# Patient Record
Sex: Female | Born: 1955 | Race: Black or African American | Hispanic: No | Smoking: Never smoker
Health system: Southern US, Community
[De-identification: ages and names within clinical notes are randomized; demographics above are authoritative.]

## PROBLEM LIST (undated history)

## (undated) DIAGNOSIS — J45909 Unspecified asthma, uncomplicated: Secondary | ICD-10-CM

## (undated) DIAGNOSIS — B019 Varicella without complication: Secondary | ICD-10-CM

## (undated) DIAGNOSIS — E669 Obesity, unspecified: Secondary | ICD-10-CM

## (undated) DIAGNOSIS — T7840XA Allergy, unspecified, initial encounter: Secondary | ICD-10-CM

## (undated) DIAGNOSIS — Z0000 Encounter for general adult medical examination without abnormal findings: Secondary | ICD-10-CM

## (undated) DIAGNOSIS — D573 Sickle-cell trait: Secondary | ICD-10-CM

## (undated) DIAGNOSIS — G4700 Insomnia, unspecified: Secondary | ICD-10-CM

## (undated) DIAGNOSIS — Z86010 Personal history of colonic polyps: Secondary | ICD-10-CM

## (undated) DIAGNOSIS — B059 Measles without complication: Secondary | ICD-10-CM

## (undated) DIAGNOSIS — I10 Essential (primary) hypertension: Secondary | ICD-10-CM

## (undated) DIAGNOSIS — E785 Hyperlipidemia, unspecified: Secondary | ICD-10-CM

## (undated) DIAGNOSIS — IMO0001 Reserved for inherently not codable concepts without codable children: Secondary | ICD-10-CM

## (undated) HISTORY — DX: Allergy, unspecified, initial encounter: T78.40XA

## (undated) HISTORY — DX: Measles without complication: B05.9

## (undated) HISTORY — DX: Encounter for general adult medical examination without abnormal findings: Z00.00

## (undated) HISTORY — DX: Obesity, unspecified: E66.9

## (undated) HISTORY — DX: Hyperlipidemia, unspecified: E78.5

## (undated) HISTORY — DX: Unspecified asthma, uncomplicated: J45.909

## (undated) HISTORY — DX: Personal history of colonic polyps: Z86.010

## (undated) HISTORY — DX: Reserved for inherently not codable concepts without codable children: IMO0001

## (undated) HISTORY — DX: Varicella without complication: B01.9

## (undated) HISTORY — DX: Insomnia, unspecified: G47.00

## (undated) HISTORY — DX: Essential (primary) hypertension: I10

## (undated) HISTORY — DX: Sickle-cell trait: D57.3

---

## 1985-01-08 HISTORY — PX: OTHER SURGICAL HISTORY: SHX169

## 1991-01-09 HISTORY — PX: KNEE SURGERY: SHX244

## 2009-01-08 HISTORY — PX: ABDOMINAL HYSTERECTOMY: SHX81

## 2009-10-31 ENCOUNTER — Ambulatory Visit (HOSPITAL_BASED_OUTPATIENT_CLINIC_OR_DEPARTMENT_OTHER): Admission: RE | Admit: 2009-10-31 | Discharge: 2009-10-31 | Payer: Self-pay | Admitting: Family Medicine

## 2009-10-31 ENCOUNTER — Ambulatory Visit: Payer: Self-pay | Admitting: Diagnostic Radiology

## 2010-01-08 LAB — HM COLONOSCOPY

## 2010-09-27 ENCOUNTER — Other Ambulatory Visit (HOSPITAL_BASED_OUTPATIENT_CLINIC_OR_DEPARTMENT_OTHER): Payer: Self-pay | Admitting: Nurse Practitioner

## 2010-09-27 DIAGNOSIS — Z139 Encounter for screening, unspecified: Secondary | ICD-10-CM

## 2010-11-03 ENCOUNTER — Ambulatory Visit (HOSPITAL_BASED_OUTPATIENT_CLINIC_OR_DEPARTMENT_OTHER)
Admission: RE | Admit: 2010-11-03 | Discharge: 2010-11-03 | Disposition: A | Discharge status: Home / Self Care | Payer: Federal, State, Local not specified - PPO | Source: Ambulatory Visit | Attending: Nurse Practitioner | Admitting: Nurse Practitioner

## 2010-11-03 DIAGNOSIS — Z139 Encounter for screening, unspecified: Secondary | ICD-10-CM

## 2010-11-03 DIAGNOSIS — Z1231 Encounter for screening mammogram for malignant neoplasm of breast: Secondary | ICD-10-CM

## 2011-10-09 ENCOUNTER — Other Ambulatory Visit (HOSPITAL_BASED_OUTPATIENT_CLINIC_OR_DEPARTMENT_OTHER): Payer: Self-pay | Admitting: Nurse Practitioner

## 2011-10-09 DIAGNOSIS — Z1231 Encounter for screening mammogram for malignant neoplasm of breast: Secondary | ICD-10-CM

## 2011-11-08 ENCOUNTER — Ambulatory Visit (HOSPITAL_BASED_OUTPATIENT_CLINIC_OR_DEPARTMENT_OTHER)
Admission: RE | Admit: 2011-11-08 | Discharge: 2011-11-08 | Disposition: A | Discharge status: Home / Self Care | Payer: Federal, State, Local not specified - PPO | Source: Ambulatory Visit | Attending: Nurse Practitioner | Admitting: Nurse Practitioner

## 2011-11-08 DIAGNOSIS — Z1231 Encounter for screening mammogram for malignant neoplasm of breast: Secondary | ICD-10-CM

## 2011-11-08 IMAGING — MG MM DIGITAL SCREENING BILAT
3 series · 3 of 3 positions shown · non-contrast
Comparison: Previous exams.

CLINICAL DATA: Screening.

DIGITAL BILATERAL SCREENING MAMMOGRAM WITH CAD

[L CC]
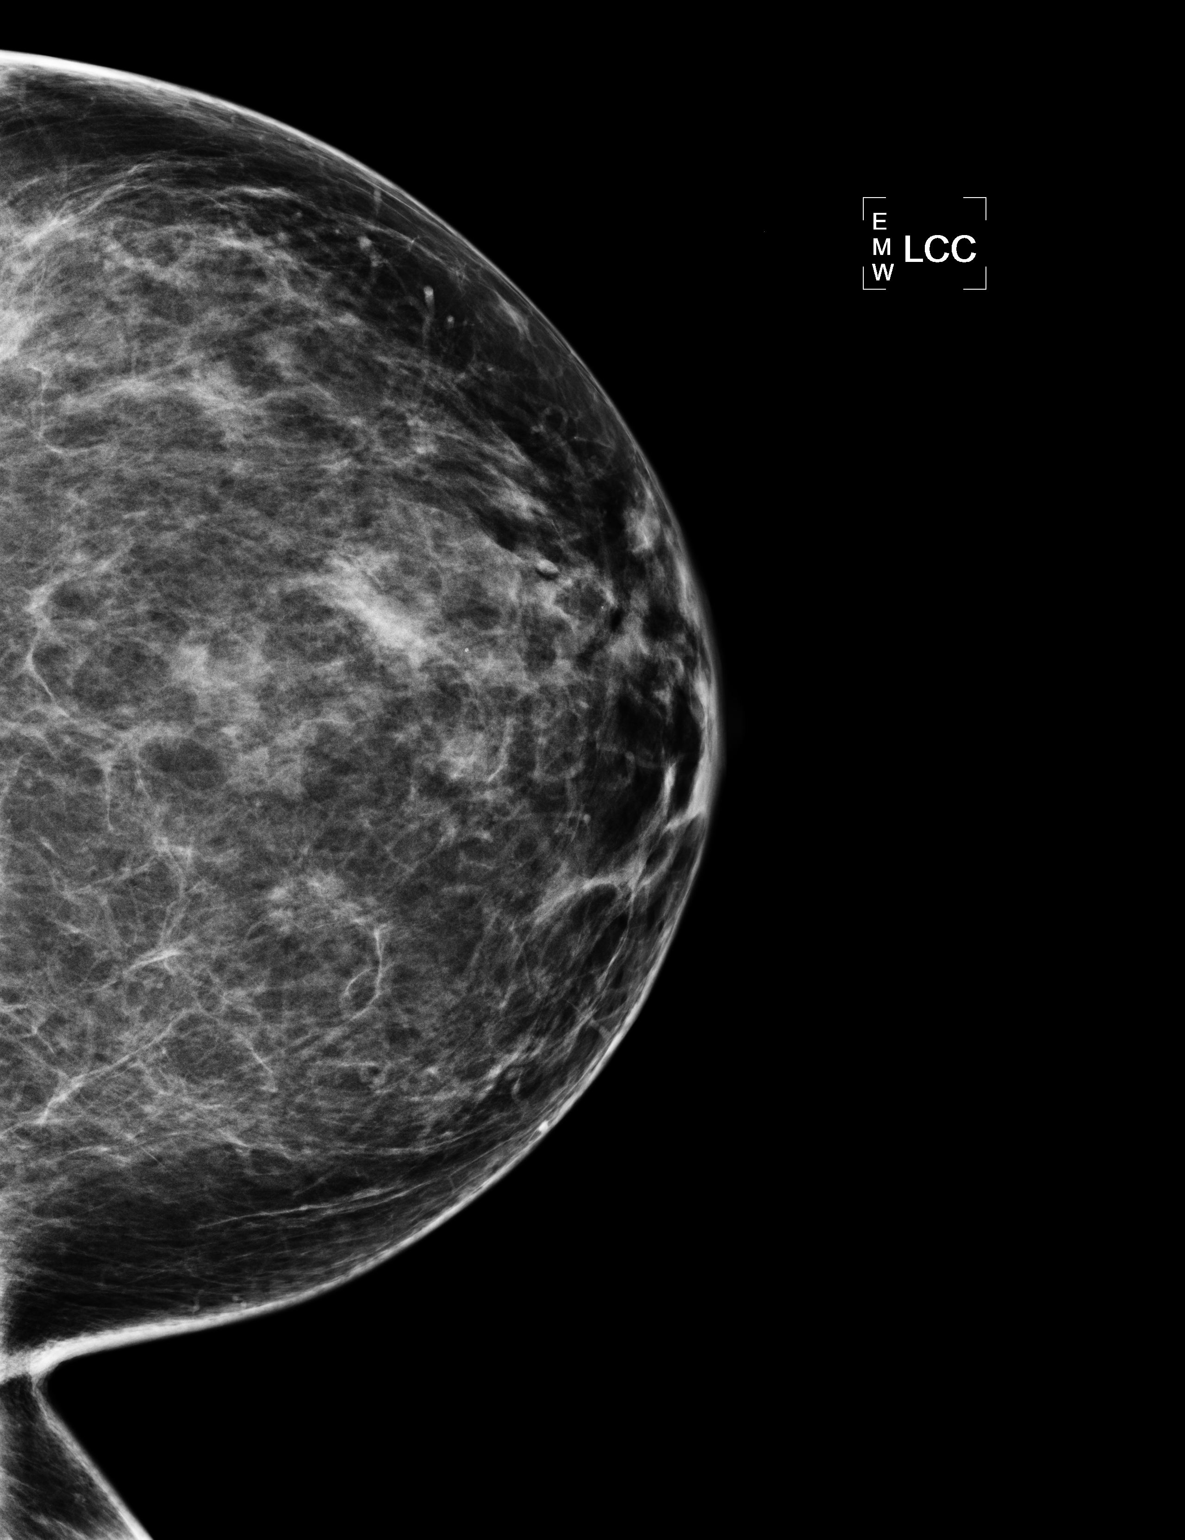

[L MLO]
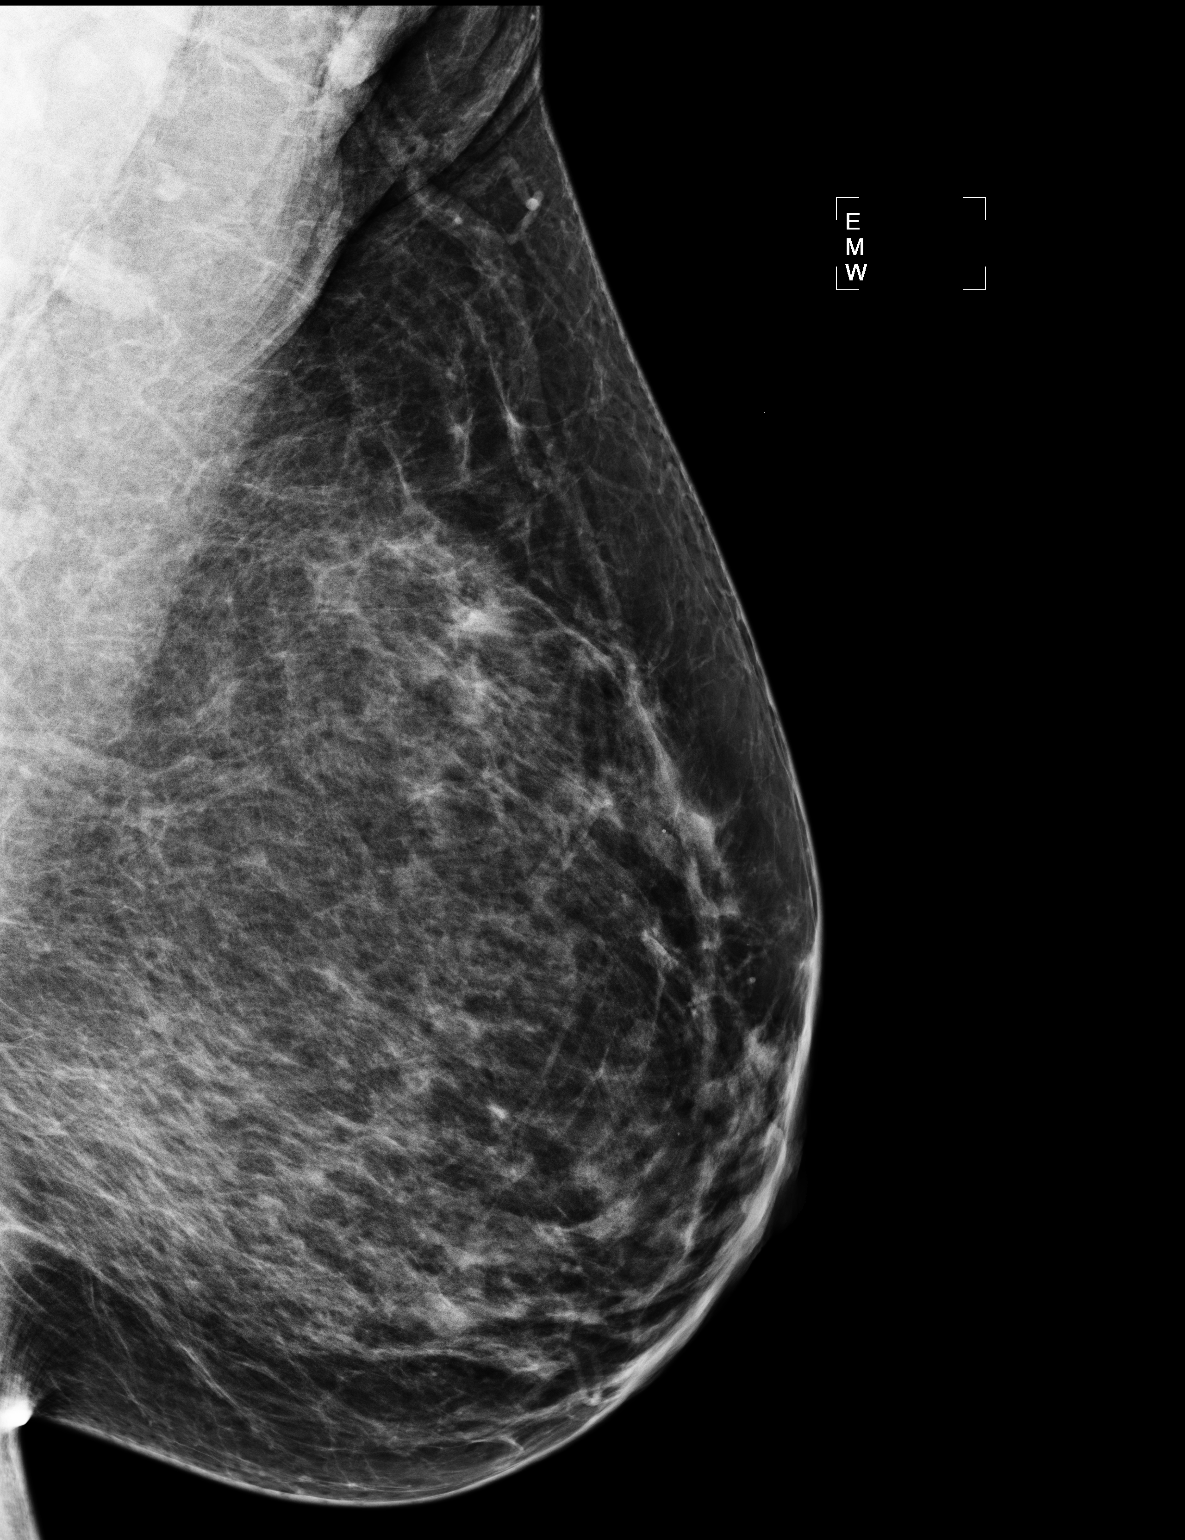

[R MLO]
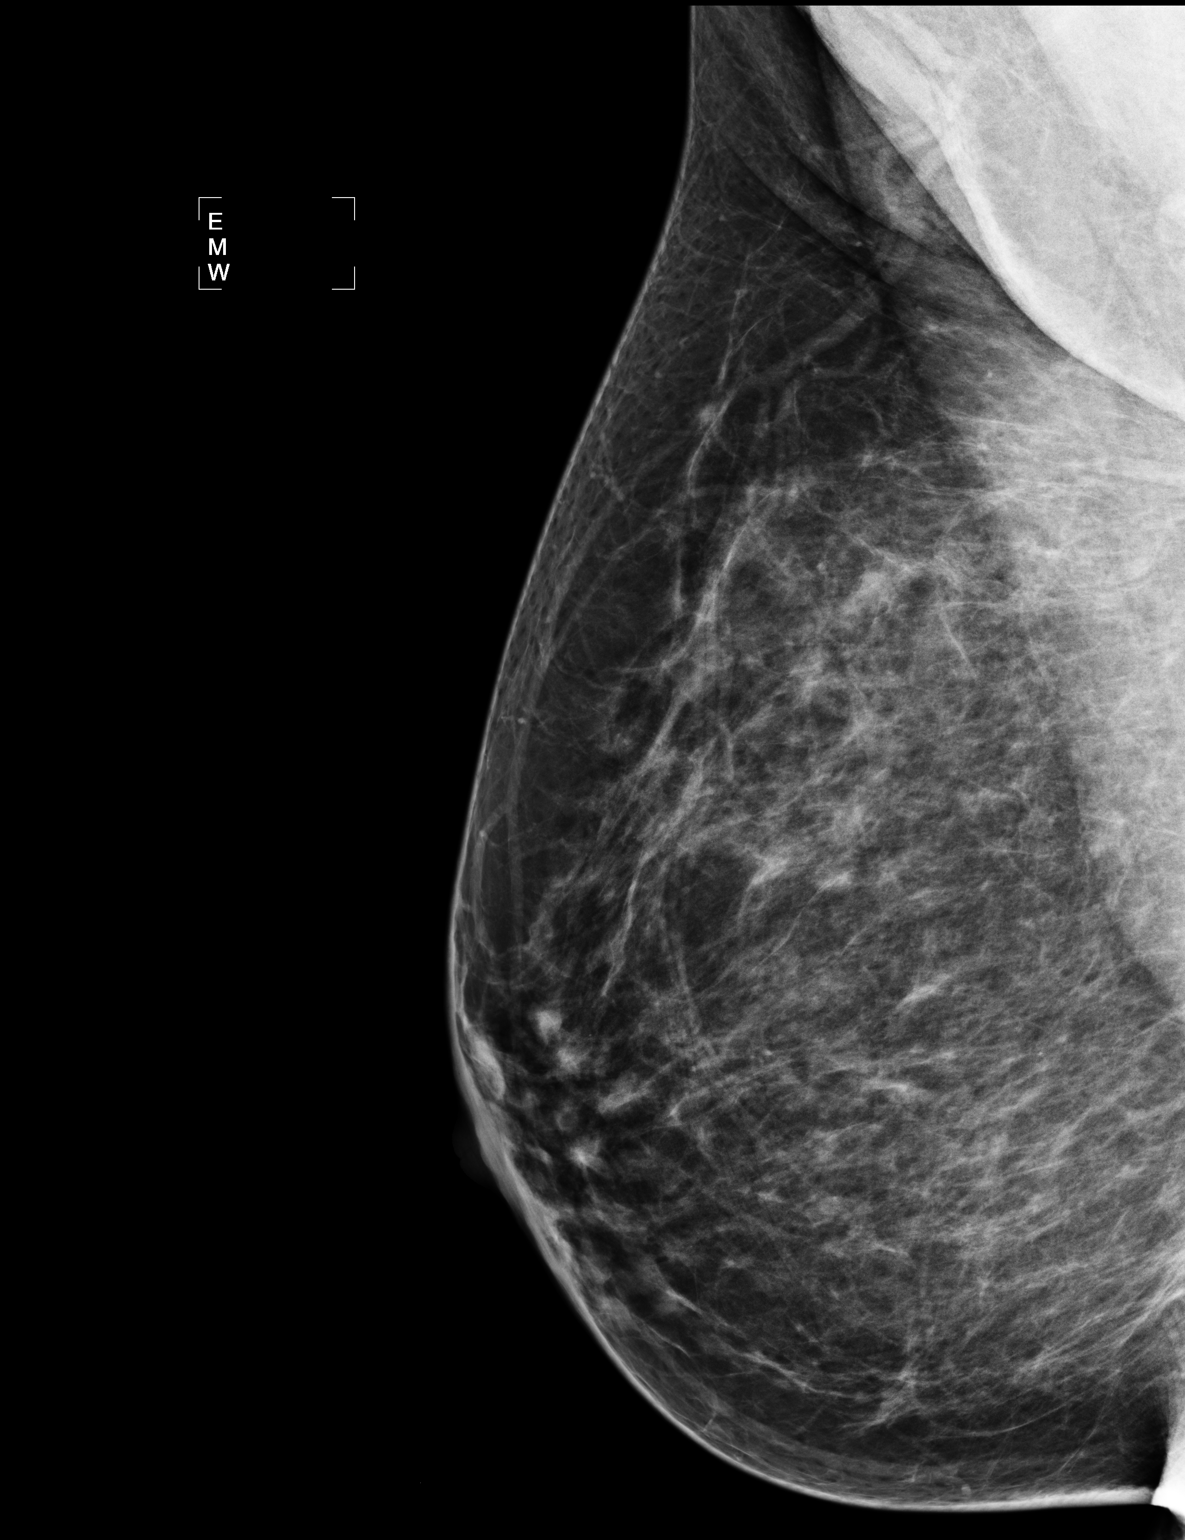

[3 of 3 positions shown; findings below may reference images not displayed]

FINDINGS: There are scattered fibroglandular densities. No
suspicious masses, architectural distortion, or calcifications are
present.

Images were processed with CAD.
IMPRESSION: No mammographic evidence of malignancy.

A result letter of this screening mammogram will be mailed directly
to the patient.

RECOMMENDATION:
Screening mammogram in one year. (Code:[id number])

BI-RADS CATEGORY 1:  Negative.

## 2012-10-06 ENCOUNTER — Other Ambulatory Visit: Payer: Self-pay | Admitting: Obstetrics and Gynecology

## 2012-10-06 ENCOUNTER — Ambulatory Visit (HOSPITAL_BASED_OUTPATIENT_CLINIC_OR_DEPARTMENT_OTHER)
Admission: RE | Admit: 2012-10-06 | Discharge: 2012-10-06 | Disposition: A | Discharge status: Home / Self Care | Payer: Federal, State, Local not specified - PPO | Source: Ambulatory Visit | Attending: Obstetrics and Gynecology | Admitting: Obstetrics and Gynecology

## 2012-10-06 DIAGNOSIS — Z1231 Encounter for screening mammogram for malignant neoplasm of breast: Secondary | ICD-10-CM

## 2012-11-10 ENCOUNTER — Other Ambulatory Visit: Payer: Self-pay | Admitting: Family Medicine

## 2012-11-10 ENCOUNTER — Ambulatory Visit (HOSPITAL_BASED_OUTPATIENT_CLINIC_OR_DEPARTMENT_OTHER)
Admission: RE | Admit: 2012-11-10 | Discharge: 2012-11-10 | Disposition: A | Discharge status: Home / Self Care | Payer: Federal, State, Local not specified - PPO | Source: Ambulatory Visit | Attending: Obstetrics and Gynecology | Admitting: Obstetrics and Gynecology

## 2012-11-10 DIAGNOSIS — Z1231 Encounter for screening mammogram for malignant neoplasm of breast: Secondary | ICD-10-CM

## 2012-11-10 IMAGING — MG MM DIGITAL SCREENING
4 series · 4 of 4 positions shown · non-contrast
Comparison: Previous exam(s).

CLINICAL DATA: Screening.

EXAM:
DIGITAL SCREENING BILATERAL MAMMOGRAM WITH CAD

[R CC]
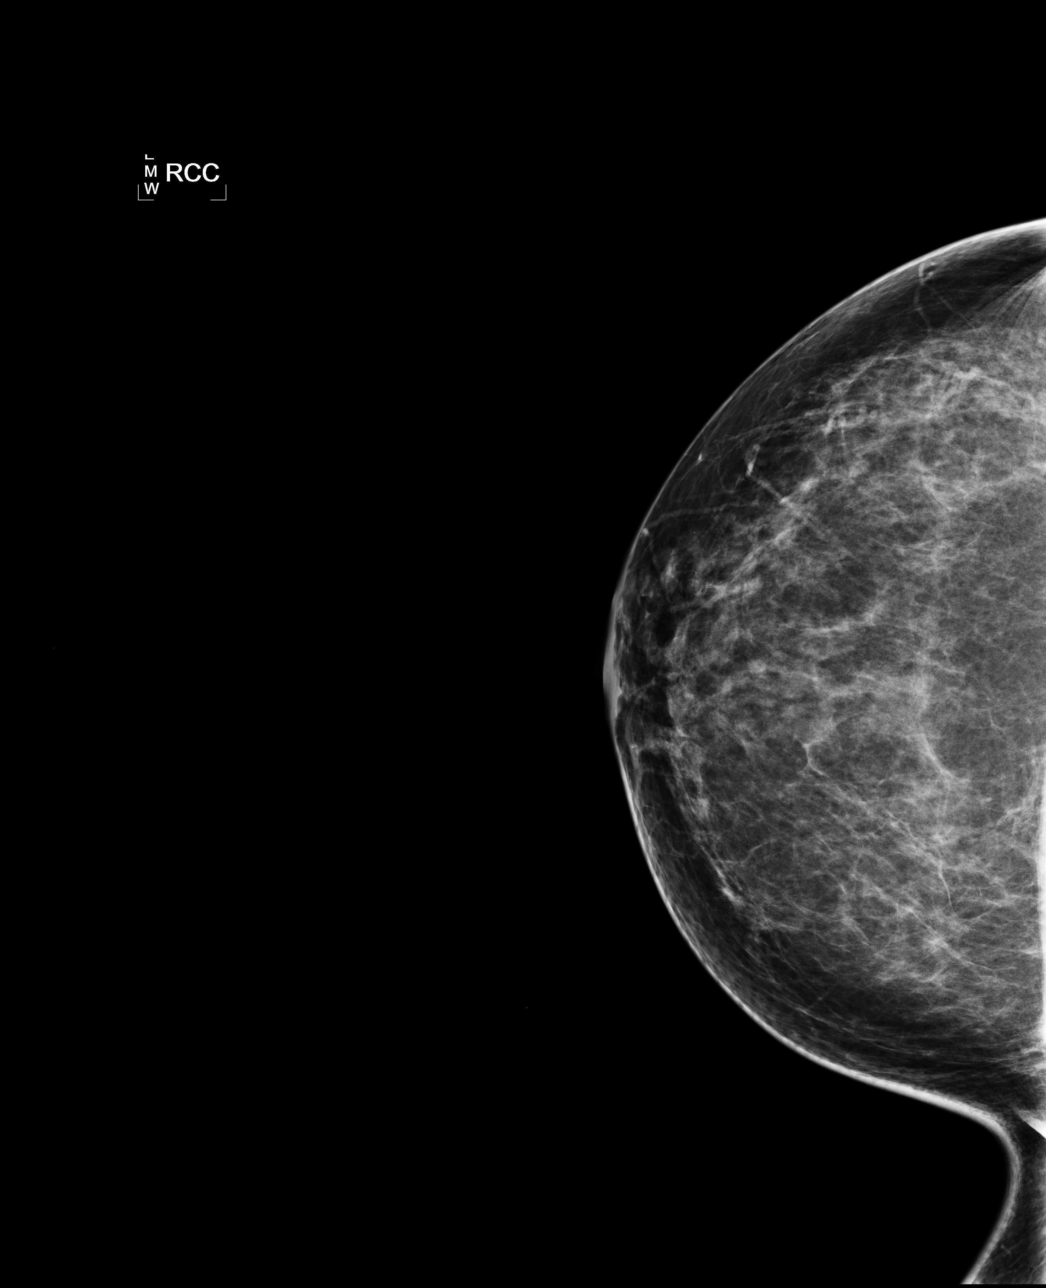

[L CC]
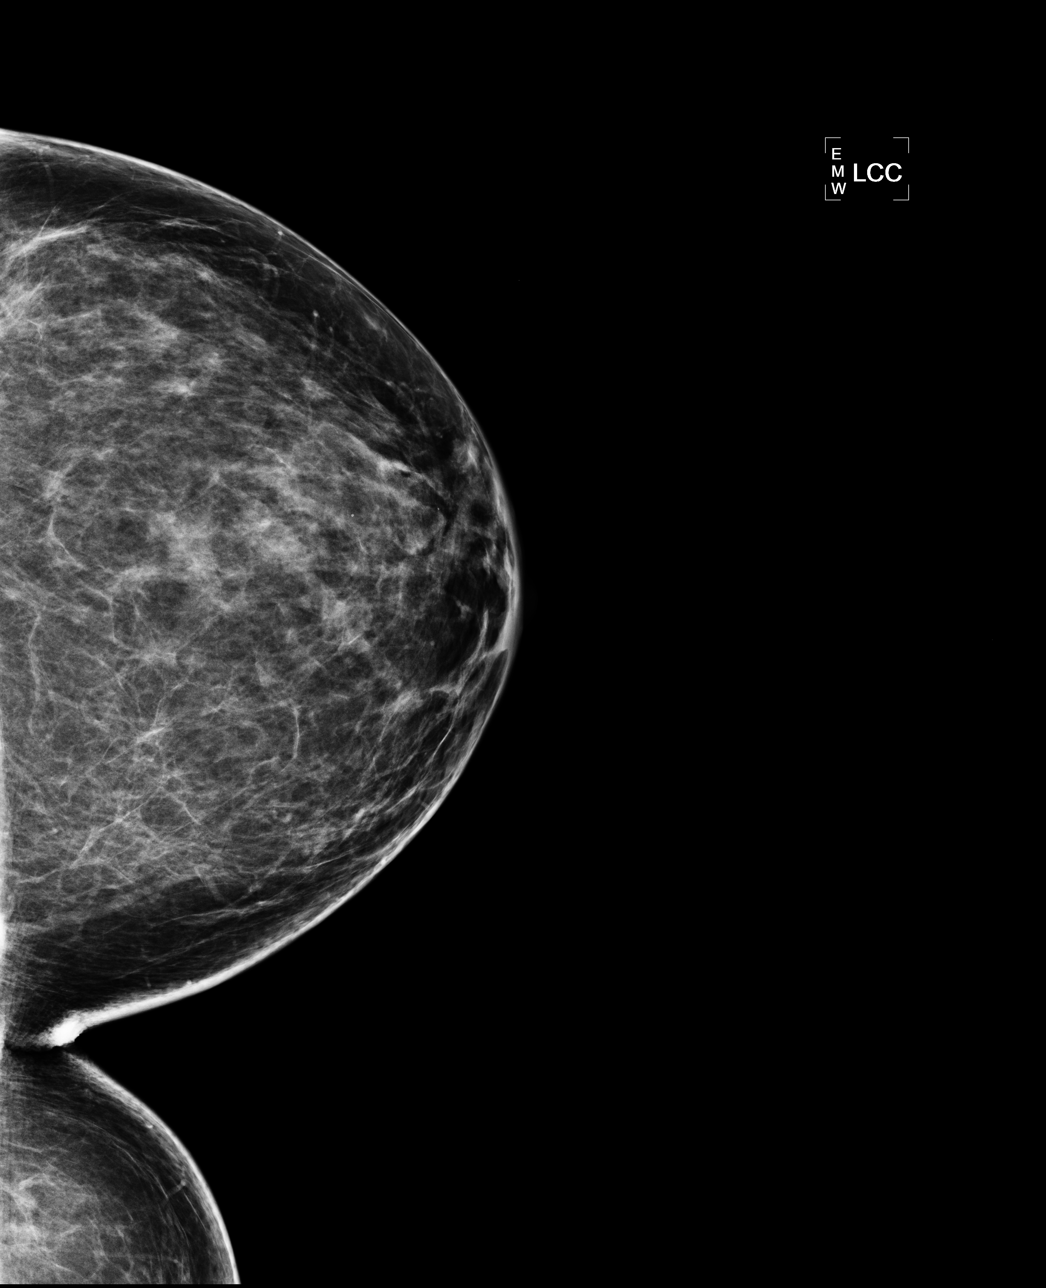

[L MLO]
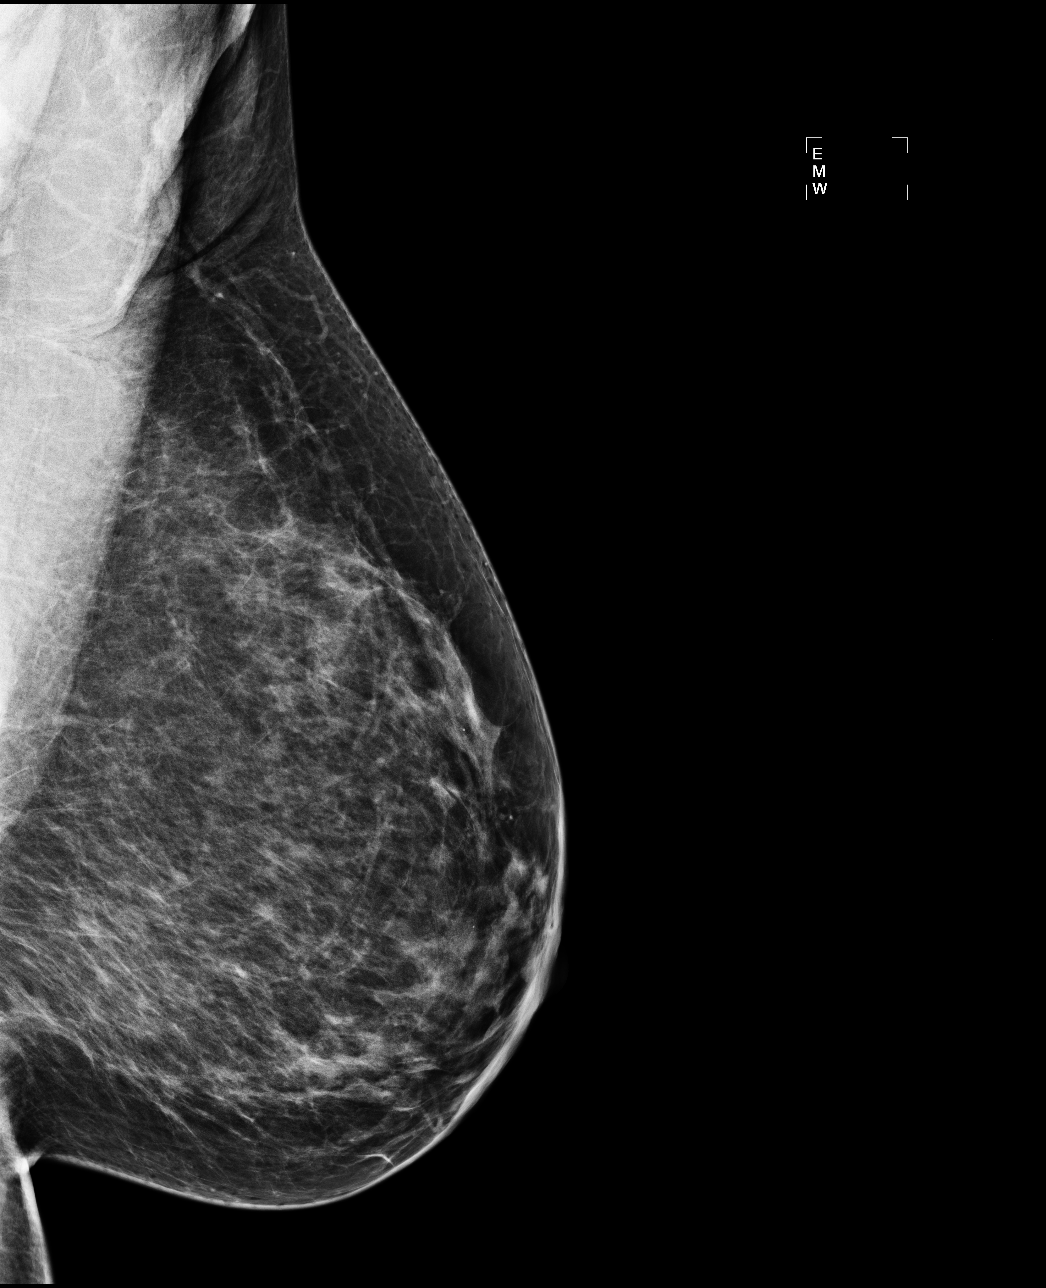

[R MLO]
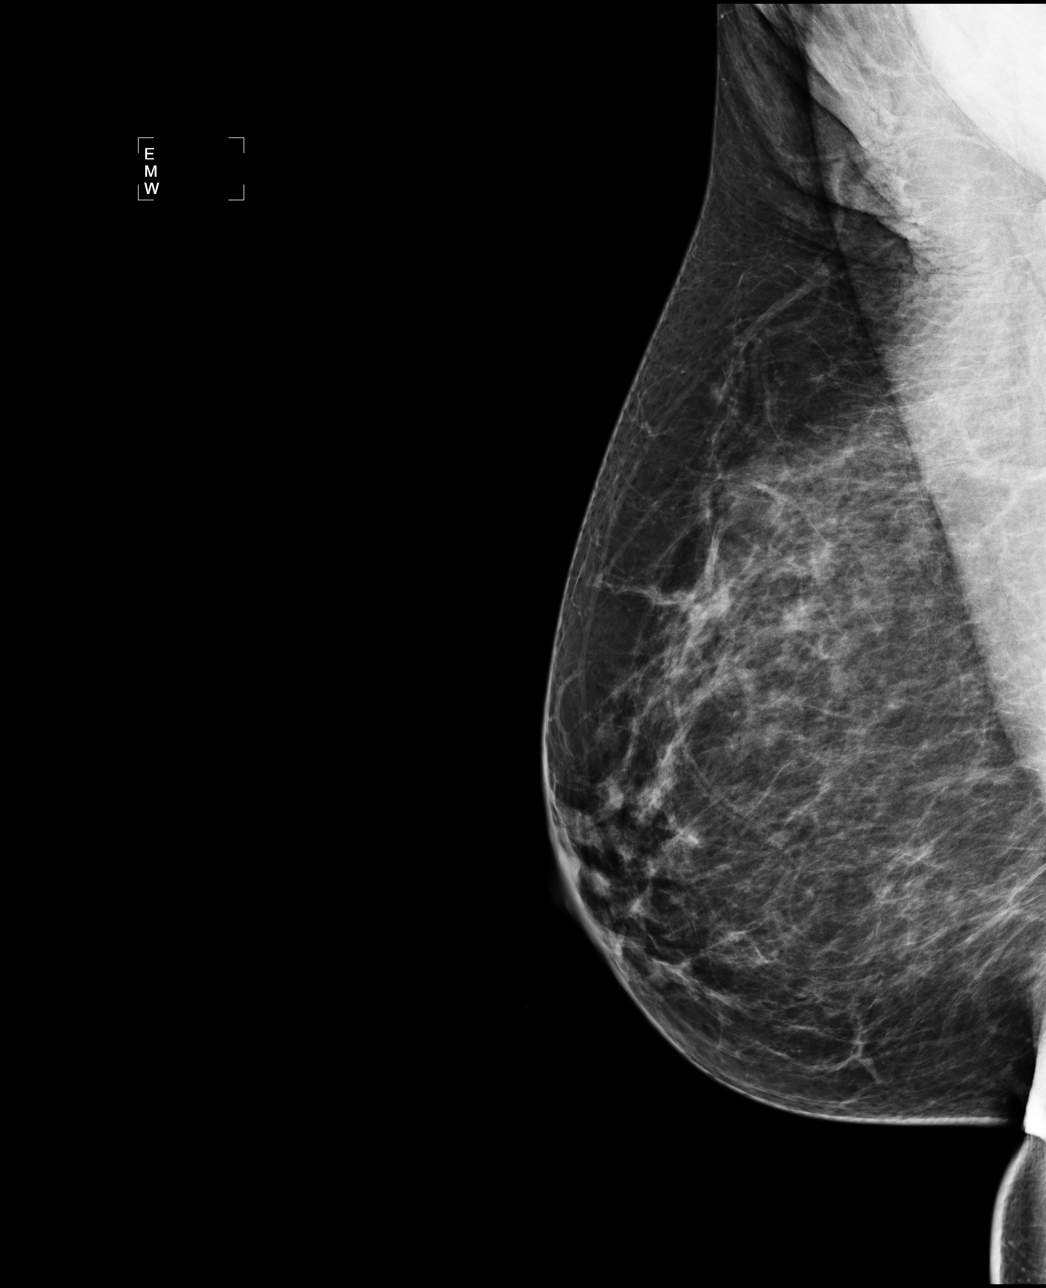

[4 of 4 positions shown; findings below may reference images not displayed]

ACR Breast Density Category b: There are scattered areas of
fibroglandular density.
FINDINGS: There are no findings suspicious for malignancy. Images were
processed with CAD.
IMPRESSION: No mammographic evidence of malignancy. A result letter of this
screening mammogram will be mailed directly to the patient.

RECOMMENDATION:
Screening mammogram in one year. (Code:[id number])

BI-RADS CATEGORY  1: Negative

## 2012-11-11 ENCOUNTER — Encounter: Payer: Self-pay | Admitting: Family Medicine

## 2012-11-11 ENCOUNTER — Ambulatory Visit (INDEPENDENT_AMBULATORY_CARE_PROVIDER_SITE_OTHER): Payer: Federal, State, Local not specified - PPO | Admitting: Family Medicine

## 2012-11-11 VITALS — BP 124/64 | HR 88 | Temp 98.2°F | Ht 66.0 in | Wt 197.0 lb

## 2012-11-11 DIAGNOSIS — G4700 Insomnia, unspecified: Secondary | ICD-10-CM

## 2012-11-11 DIAGNOSIS — E669 Obesity, unspecified: Secondary | ICD-10-CM

## 2012-11-11 DIAGNOSIS — Z86010 Personal history of colonic polyps: Secondary | ICD-10-CM

## 2012-11-11 DIAGNOSIS — D573 Sickle-cell trait: Secondary | ICD-10-CM | POA: Insufficient documentation

## 2012-11-11 DIAGNOSIS — T7840XA Allergy, unspecified, initial encounter: Secondary | ICD-10-CM | POA: Insufficient documentation

## 2012-11-11 DIAGNOSIS — I10 Essential (primary) hypertension: Secondary | ICD-10-CM | POA: Insufficient documentation

## 2012-11-11 DIAGNOSIS — J45909 Unspecified asthma, uncomplicated: Secondary | ICD-10-CM | POA: Insufficient documentation

## 2012-11-11 DIAGNOSIS — Z23 Encounter for immunization: Secondary | ICD-10-CM

## 2012-11-11 MED ORDER — LISINOPRIL 10 MG PO TABS: 10.0000 mg | ORAL_TABLET | Freq: Every day | ORAL | Status: DC

## 2012-11-11 MED ORDER — ALBUTEROL SULFATE HFA 108 (90 BASE) MCG/ACT IN AERS: 1.0000 | INHALATION_SPRAY | Freq: Four times a day (QID) | RESPIRATORY_TRACT | Status: DC | PRN

## 2012-11-11 MED ORDER — BUDESONIDE-FORMOTEROL FUMARATE 160-4.5 MCG/ACT IN AERO: 2.0000 | INHALATION_SPRAY | Freq: Two times a day (BID) | RESPIRATORY_TRACT | Status: DC

## 2012-11-11 NOTE — Patient Instructions (Addendum)
Probiotic such as Digestive advantage MegaRed krill  Oil daily Jobst stockings, 10-20 mmhg knee, good arch support in shoes Aspercreme/Salon Pas cream   Preventive Care for Adults, Female A healthy lifestyle and preventive care can promote health and wellness. Preventive health guidelines for women include the following key practices.  A routine yearly physical is a good way to check with your caregiver about your health and preventive screening. It is a chance to share any concerns and updates on your health, and to receive a thorough exam.  Visit your dentist for a routine exam and preventive care every 6 months. Brush your teeth twice a day and floss once a day. Good oral hygiene prevents tooth decay and gum disease.  The frequency of eye exams is based on your age, health, family medical history, use of contact lenses, and other factors. Follow your caregiver's recommendations for frequency of eye exams.  Eat a healthy diet. Foods like vegetables, fruits, whole grains, low-fat dairy products, and lean protein foods contain the nutrients you need without too many calories. Decrease your intake of foods high in solid fats, added sugars, and salt. Eat the right amount of calories for you.Get information about a proper diet from your caregiver, if necessary.  Regular physical exercise is one of the most important things you can do for your health. Most adults should get at least 150 minutes of moderate-intensity exercise (any activity that increases your heart rate and causes you to sweat) each week. In addition, most adults need muscle-strengthening exercises on 2 or more days a week.  Maintain a healthy weight. The body mass index (BMI) is a screening tool to identify possible weight problems. It provides an estimate of body fat based on height and weight. Your caregiver can help determine your BMI, and can help you achieve or maintain a healthy weight.For adults 20 years and older:  A BMI  below 18.5 is considered underweight.  A BMI of 18.5 to 24.9 is normal.  A BMI of 25 to 29.9 is considered overweight.  A BMI of 30 and above is considered obese.  Maintain normal blood lipids and cholesterol levels by exercising and minimizing your intake of saturated fat. Eat a balanced diet with plenty of fruit and vegetables. Blood tests for lipids and cholesterol should begin at age 61 and be repeated every 5 years. If your lipid or cholesterol levels are high, you are over 50, or you are at high risk for heart disease, you may need your cholesterol levels checked more frequently.Ongoing high lipid and cholesterol levels should be treated with medicines if diet and exercise are not effective.  If you smoke, find out from your caregiver how to quit. If you do not use tobacco, do not start.  If you are pregnant, do not drink alcohol. If you are breastfeeding, be very cautious about drinking alcohol. If you are not pregnant and choose to drink alcohol, do not exceed 1 drink per day. One drink is considered to be 12 ounces (355 mL) of beer, 5 ounces (148 mL) of wine, or 1.5 ounces (44 mL) of liquor.  Avoid use of street drugs. Do not share needles with anyone. Ask for help if you need support or instructions about stopping the use of drugs.  High blood pressure causes heart disease and increases the risk of stroke. Your blood pressure should be checked at least every 1 to 2 years. Ongoing high blood pressure should be treated with medicines if weight loss and exercise  are not effective.  If you are 80 to 58 years old, ask your caregiver if you should take aspirin to prevent strokes.  Diabetes screening involves taking a blood sample to check your fasting blood sugar level. This should be done once every 3 years, after age 58, if you are within normal weight and without risk factors for diabetes. Testing should be considered at a younger age or be carried out more frequently if you are  overweight and have at least 1 risk factor for diabetes.  Breast cancer screening is essential preventive care for women. You should practice "breast self-awareness." This means understanding the normal appearance and feel of your breasts and may include breast self-examination. Any changes detected, no matter how small, should be reported to a caregiver. Women in their 60s and 30s should have a clinical breast exam (CBE) by a caregiver as part of a regular health exam every 1 to 3 years. After age 70, women should have a CBE every year. Starting at age 27, women should consider having a mammography (breast X-ray test) every year. Women who have a family history of breast cancer should talk to their caregiver about genetic screening. Women at a high risk of breast cancer should talk to their caregivers about having magnetic resonance imaging (MRI) and a mammography every year.  The Pap test is a screening test for cervical cancer. A Pap test can show cell changes on the cervix that might become cervical cancer if left untreated. A Pap test is a procedure in which cells are obtained and examined from the lower end of the uterus (cervix).  Women should have a Pap test starting at age 42.  Between ages 32 and 50, Pap tests should be repeated every 2 years.  Beginning at age 69, you should have a Pap test every 3 years as long as the past 3 Pap tests have been normal.  Some women have medical problems that increase the chance of getting cervical cancer. Talk to your caregiver about these problems. It is especially important to talk to your caregiver if a new problem develops soon after your last Pap test. In these cases, your caregiver may recommend more frequent screening and Pap tests.  The above recommendations are the same for women who have or have not gotten the vaccine for human papillomavirus (HPV).  If you had a hysterectomy for a problem that was not cancer or a condition that could lead to  cancer, then you no longer need Pap tests. Even if you no longer need a Pap test, a regular exam is a good idea to make sure no other problems are starting.  If you are between ages 18 and 62, and you have had normal Pap tests going back 10 years, you no longer need Pap tests. Even if you no longer need a Pap test, a regular exam is a good idea to make sure no other problems are starting.  If you have had past treatment for cervical cancer or a condition that could lead to cancer, you need Pap tests and screening for cancer for at least 20 years after your treatment.  If Pap tests have been discontinued, risk factors (such as a new sexual partner) need to be reassessed to determine if screening should be resumed.  The HPV test is an additional test that may be used for cervical cancer screening. The HPV test looks for the virus that can cause the cell changes on the cervix. The cells collected  during the Pap test can be tested for HPV. The HPV test could be used to screen women aged 60 years and older, and should be used in women of any age who have unclear Pap test results. After the age of 1, women should have HPV testing at the same frequency as a Pap test.  Colorectal cancer can be detected and often prevented. Most routine colorectal cancer screening begins at the age of 35 and continues through age 48. However, your caregiver may recommend screening at an earlier age if you have risk factors for colon cancer. On a yearly basis, your caregiver may provide home test kits to check for hidden blood in the stool. Use of a small camera at the end of a tube, to directly examine the colon (sigmoidoscopy or colonoscopy), can detect the earliest forms of colorectal cancer. Talk to your caregiver about this at age 41, when routine screening begins. Direct examination of the colon should be repeated every 5 to 10 years through age 60, unless early forms of pre-cancerous polyps or small growths are  found.  Hepatitis C blood testing is recommended for all people born from [birth year] through 1965 and any individual with known risks for hepatitis C.  Practice safe sex. Use condoms and avoid high-risk sexual practices to reduce the spread of sexually transmitted infections (STIs). STIs include gonorrhea, chlamydia, syphilis, trichomonas, herpes, HPV, and human immunodeficiency virus (HIV). Herpes, HIV, and HPV are viral illnesses that have no cure. They can result in disability, cancer, and death. Sexually active women aged 44 and younger should be checked for chlamydia. Older women with new or multiple partners should also be tested for chlamydia. Testing for other STIs is recommended if you are sexually active and at increased risk.  Osteoporosis is a disease in which the bones lose minerals and strength with aging. This can result in serious bone fractures. The risk of osteoporosis can be identified using a bone density scan. Women ages 67 and over and women at risk for fractures or osteoporosis should discuss screening with their caregivers. Ask your caregiver whether you should take a calcium supplement or vitamin D to reduce the rate of osteoporosis.  Menopause can be associated with physical symptoms and risks. Hormone replacement therapy is available to decrease symptoms and risks. You should talk to your caregiver about whether hormone replacement therapy is right for you.  Use sunscreen with sun protection factor (SPF) of 30 or more. Apply sunscreen liberally and repeatedly throughout the day. You should seek shade when your shadow is shorter than you. Protect yourself by wearing long sleeves, pants, a wide-brimmed hat, and sunglasses year round, whenever you are outdoors.  Once a month, do a whole body skin exam, using a mirror to look at the skin on your back. Notify your caregiver of new moles, moles that have irregular borders, moles that are larger than a pencil eraser, or moles that have  changed in shape or color.  Stay current with required immunizations.  Influenza. You need a dose every fall (or winter). The composition of the flu vaccine changes each year, so being vaccinated once is not enough.  Pneumococcal polysaccharide. You need 1 to 2 doses if you smoke cigarettes or if you have certain chronic medical conditions. You need 1 dose at age 42 (or older) if you have never been vaccinated.  Tetanus, diphtheria, pertussis (Tdap, Td). Get 1 dose of Tdap vaccine if you are younger than age 50, are over 11 and have  contact with an infant, are a Research scientist (physical sciences), are pregnant, or simply want to be protected from whooping cough. After that, you need a Td booster dose every 10 years. Consult your caregiver if you have not had at least 3 tetanus and diphtheria-containing shots sometime in your life or have a deep or dirty wound.  HPV. You need this vaccine if you are a woman age 42 or younger. The vaccine is given in 3 doses over 6 months.  Measles, mumps, rubella (MMR). You need at least 1 dose of MMR if you were born in 1957 or later. You may also need a second dose.  Meningococcal. If you are age 51 to 107 and a first-year college student living in a residence hall, or have one of several medical conditions, you need to get vaccinated against meningococcal disease. You may also need additional booster doses.  Zoster (shingles). If you are age 64 or older, you should get this vaccine.  Varicella (chickenpox). If you have never had chickenpox or you were vaccinated but received only 1 dose, talk to your caregiver to find out if you need this vaccine.  Hepatitis A. You need this vaccine if you have a specific risk factor for hepatitis A virus infection or you simply wish to be protected from this disease. The vaccine is usually given as 2 doses, 6 to 18 months apart.  Hepatitis B. You need this vaccine if you have a specific risk factor for hepatitis B virus infection or you  simply wish to be protected from this disease. The vaccine is given in 3 doses, usually over 6 months. Preventive Services / Frequency Ages 25 to 100  Blood pressure check.** / Every 1 to 2 years.  Lipid and cholesterol check.** / Every 5 years beginning at age 27.  Clinical breast exam.** / Every 3 years for women in their 53s and 30s.  Pap test.** / Every 2 years from ages 101 through 59. Every 3 years starting at age 36 through age 40 or 62 with a history of 3 consecutive normal Pap tests.  HPV screening.** / Every 3 years from ages 81 through ages 66 to 19 with a history of 3 consecutive normal Pap tests.  Hepatitis C blood test.** / For any individual with known risks for hepatitis C.  Skin self-exam. / Monthly.  Influenza immunization.** / Every year.  Pneumococcal polysaccharide immunization.** / 1 to 2 doses if you smoke cigarettes or if you have certain chronic medical conditions.  Tetanus, diphtheria, pertussis (Tdap, Td) immunization. / A one-time dose of Tdap vaccine. After that, you need a Td booster dose every 10 years.  HPV immunization. / 3 doses over 6 months, if you are 43 and younger.  Measles, mumps, rubella (MMR) immunization. / You need at least 1 dose of MMR if you were born in 1957 or later. You may also need a second dose.  Meningococcal immunization. / 1 dose if you are age 39 to 92 and a first-year college student living in a residence hall, or have one of several medical conditions, you need to get vaccinated against meningococcal disease. You may also need additional booster doses.  Varicella immunization.** / Consult your caregiver.  Hepatitis A immunization.** / Consult your caregiver. 2 doses, 6 to 18 months apart.  Hepatitis B immunization.** / Consult your caregiver. 3 doses usually over 6 months. Ages 70 to 85  Blood pressure check.** / Every 1 to 2 years.  Lipid and cholesterol check.** / Every  5 years beginning at age 52.  Clinical breast  exam.** / Every year after age 23.  Mammogram.** / Every year beginning at age 101 and continuing for as long as you are in good health. Consult with your caregiver.  Pap test.** / Every 3 years starting at age 39 through age 59 or 36 with a history of 3 consecutive normal Pap tests.  HPV screening.** / Every 3 years from ages 3 through ages 58 to 24 with a history of 3 consecutive normal Pap tests.  Fecal occult blood test (FOBT) of stool. / Every year beginning at age 54 and continuing until age 41. You may not need to do this test if you get a colonoscopy every 10 years.  Flexible sigmoidoscopy or colonoscopy.** / Every 5 years for a flexible sigmoidoscopy or every 10 years for a colonoscopy beginning at age 25 and continuing until age 58.  Hepatitis C blood test.** / For all people born from [birth year] through 1965 and any individual with known risks for hepatitis C.  Skin self-exam. / Monthly.  Influenza immunization.** / Every year.  Pneumococcal polysaccharide immunization.** / 1 to 2 doses if you smoke cigarettes or if you have certain chronic medical conditions.  Tetanus, diphtheria, pertussis (Tdap, Td) immunization.** / A one-time dose of Tdap vaccine. After that, you need a Td booster dose every 10 years.  Measles, mumps, rubella (MMR) immunization. / You need at least 1 dose of MMR if you were born in 1957 or later. You may also need a second dose.  Varicella immunization.** / Consult your caregiver.  Meningococcal immunization.** / Consult your caregiver.  Hepatitis A immunization.** / Consult your caregiver. 2 doses, 6 to 18 months apart.  Hepatitis B immunization.** / Consult your caregiver. 3 doses, usually over 6 months. Ages 67 and over  Blood pressure check.** / Every 1 to 2 years.  Lipid and cholesterol check.** / Every 5 years beginning at age 62.  Clinical breast exam.** / Every year after age 71.  Mammogram.** / Every year beginning at age 33 and continuing  for as long as you are in good health. Consult with your caregiver.  Pap test.** / Every 3 years starting at age 31 through age 12 or 52 with a 3 consecutive normal Pap tests. Testing can be stopped between 65 and 70 with 3 consecutive normal Pap tests and no abnormal Pap or HPV tests in the past 10 years.  HPV screening.** / Every 3 years from ages 78 through ages 70 or 23 with a history of 3 consecutive normal Pap tests. Testing can be stopped between 65 and 70 with 3 consecutive normal Pap tests and no abnormal Pap or HPV tests in the past 10 years.  Fecal occult blood test (FOBT) of stool. / Every year beginning at age 64 and continuing until age 95. You may not need to do this test if you get a colonoscopy every 10 years.  Flexible sigmoidoscopy or colonoscopy.** / Every 5 years for a flexible sigmoidoscopy or every 10 years for a colonoscopy beginning at age 60 and continuing until age 12.  Hepatitis C blood test.** / For all people born from [birth year] through 1966 and any individual with known risks for hepatitis C.  Osteoporosis screening.** / A one-time screening for women ages 58 and over and women at risk for fractures or osteoporosis.  Skin self-exam. / Monthly.  Influenza immunization.** / Every year.  Pneumococcal polysaccharide immunization.** / 1 dose at age 38 (or  older) if you have never been vaccinated.  Tetanus, diphtheria, pertussis (Tdap, Td) immunization. / A one-time dose of Tdap vaccine if you are over 65 and have contact with an infant, are a Research scientist (physical sciences), or simply want to be protected from whooping cough. After that, you need a Td booster dose every 10 years.  Varicella immunization.** / Consult your caregiver.  Meningococcal immunization.** / Consult your caregiver.  Hepatitis A immunization.** / Consult your caregiver. 2 doses, 6 to 18 months apart.  Hepatitis B immunization.** / Check with your caregiver. 3 doses, usually over 6 months. ** Family history  and personal history of risk and conditions may change your caregiver's recommendations. Document Released: 02/20/2001 Document Revised: 03/19/2011 Document Reviewed: 05/22/2010 [facility] Patient Information 2014 [place], [place].

## 2012-11-16 ENCOUNTER — Encounter: Payer: Self-pay | Admitting: Family Medicine

## 2012-11-16 DIAGNOSIS — E669 Obesity, unspecified: Secondary | ICD-10-CM

## 2012-11-16 DIAGNOSIS — Z860100 Personal history of colon polyps, unspecified: Secondary | ICD-10-CM

## 2012-11-16 DIAGNOSIS — G4700 Insomnia, unspecified: Secondary | ICD-10-CM | POA: Insufficient documentation

## 2012-11-16 HISTORY — DX: Obesity, unspecified: E66.9

## 2012-11-16 HISTORY — DX: Insomnia, unspecified: G47.00

## 2012-11-16 HISTORY — DX: Personal history of colonic polyps: Z86.010

## 2012-11-16 HISTORY — DX: Personal history of colon polyps, unspecified: Z86.0100

## 2012-11-16 NOTE — Assessment & Plan Note (Signed)
Encouraged DASH diet and increased exercise as tolerated.  

## 2012-11-16 NOTE — Progress Notes (Signed)
Patient ID: Marcia Stevenson, female   DOB: 1955-04-10, 57 y.o.   MRN: [id number] Marcia Stevenson [id number] 02-24-1955 11/16/2012      Progress Note-Follow Up  Subjective  Chief Complaint  Chief Complaint  Patient presents with  . Establish Care    new patient  . Injections    flu    HPI  Patient is a 57 yo female in today for new patient appt. No recent illness, no fevers, chills, chest pain, palp, sob, gi or gu c/o. Does struggle with insomnia. Has tried numerous meds and have not been successful. Has most trouble staying asleep has had 2 sleep studies in past 2 years ago and 5 years ago. Negative for sleep apnea. Struggles with some joint pain. No recent illness, chest pain, sob, palp, gi or gu c/o. Has long history of allergies has seen allergist in past Past Medical History  Diagnosis Date  . Chicken pox as a child  . Measles as a child  . Hyperlipidemia   . Hypertension   . Asthma     cough  . Allergy     cigarette smoke, pollen  . Sickle cell trait   . Obesity, unspecified 11/16/2012  . Insomnia 11/16/2012  . Personal history of colonic polyps 11/16/2012    1 polyp colonoscopy 2012    Past Surgical History  Procedure Laterality Date  . Abdominal hysterectomy  01-08-2009    total  . Gallstones removed  1987  . Knee surgery  1993    left- arthoscopy  . Cesarean section  1980    Family History  Problem Relation Age of Onset  . Cancer Mother 36    ovarian  . Hypertension Mother   . Deep vein thrombosis Father   . Hypertension Sister   . Gout Sister   . Sickle cell trait Sister   . Asthma Son   . Cataracts Son   . Sickle cell trait Brother   . Sickle cell anemia Brother 24  . Sickle cell trait Brother   . Sickle cell trait Sister   . Sickle cell trait Brother     History   Social History  . Marital Status: Divorced    Spouse Name: N/A    Number of Children: N/A  . Years of Education: N/A   Occupational History  . Not on file.   Social History Main  Topics  . Smoking status: Never Smoker   . Smokeless tobacco: Never Used  . Alcohol Use: No  . Drug Use: No  . 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 Not on file   Social History Narrative  . No narrative on file    No current outpatient prescriptions on file prior to visit.   No current facility-administered medications on file prior to visit.    No Known Allergies  Review of Systems  Review of Systems  Constitutional: Negative for fever and malaise/fatigue.  HENT: Negative for congestion.   Eyes: Negative for discharge.  Respiratory: Negative for shortness of breath.   Cardiovascular: Negative for chest pain, palpitations and leg swelling.  Gastrointestinal: Negative for nausea, abdominal pain and diarrhea.  Genitourinary: Negative for dysuria.  Musculoskeletal: Positive for joint pain. Negative for falls.  Skin: Negative for rash.  Neurological: Negative for loss of consciousness and headaches.  Endo/Heme/Allergies: Negative for polydipsia.  Psychiatric/Behavioral: Negative for depression and suicidal ideas. The patient is not  nervous/anxious and does not have insomnia.     Objective  BP 124/64  Pulse 88  Temp(Src) 98.2 F (36.8 C) (Oral)  Ht 5\' 6"  (1.676 m)  Wt 197 lb (89.359 kg)  BMI 31.81 kg/m2  SpO2 97%  Physical Exam  Physical Exam  Constitutional: She is oriented to person, place, and time and well-developed, well-nourished, and in no distress. No distress.  HENT:  Head: Normocephalic and atraumatic.  Eyes: Conjunctivae are normal.  Neck: Neck supple. No thyromegaly present.  Cardiovascular: Normal rate, regular rhythm and normal heart sounds.   No murmur heard. Pulmonary/Chest: Effort normal and breath sounds normal. She has no wheezes.  Abdominal: She exhibits no distension and no mass.  Musculoskeletal: She exhibits no edema.  Lymphadenopathy:    She has no  cervical adenopathy.  Neurological: She is alert and oriented to person, place, and time.  Skin: Skin is warm and dry. No rash noted. She is not diaphoretic.  Psychiatric: Memory, affect and judgment normal.      Assessment & Plan  Hypertension Well controlled, no changes today.  Allergy Has seen allergist in past. No good treatments, may use antihistamines and nasal saline prn. Given flu tdap today had flu shot on 10/08/12, Pneumonia shot in 2011.    Insomnia Discussed need for good sleep hygiene and will monitor, check labs.  Obesity, unspecified Encouraged DASH diet and increased exercise as tolerated.

## 2012-11-16 NOTE — Assessment & Plan Note (Signed)
Discussed need for good sleep hygiene and will monitor, check labs.

## 2012-11-16 NOTE — Assessment & Plan Note (Addendum)
Has seen allergist in past. No good treatments, may use antihistamines and nasal saline prn. Given flu tdap today had flu shot on 10/08/12, Pneumonia shot in 2011.

## 2012-11-16 NOTE — Assessment & Plan Note (Signed)
Well controlled, no changes today 

## 2012-11-18 ENCOUNTER — Telehealth: Payer: Self-pay | Admitting: *Deleted

## 2012-11-18 DIAGNOSIS — Z79899 Other long term (current) drug therapy: Secondary | ICD-10-CM

## 2012-11-18 DIAGNOSIS — I10 Essential (primary) hypertension: Secondary | ICD-10-CM

## 2012-11-18 LAB — LIPID PANEL
HDL: 60 mg/dL (ref >39)
LDL Cholesterol: 120 mg/dL — ABNORMAL HIGH (ref 0–99)
Total CHOL/HDL Ratio: 3.2 Ratio
VLDL: 9 mg/dL (ref 0–40)

## 2012-11-18 LAB — BASIC METABOLIC PANEL
CO2: 26 mEq/L (ref 19–32)
Chloride: 105 mEq/L (ref 96–112)
Creat: 0.83 mg/dL (ref 0.50–1.10)
Potassium: 4.4 mEq/L (ref 3.5–5.3)
Sodium: 141 mEq/L (ref 135–145)

## 2012-11-18 LAB — CBC
HCT: 34.3 % — ABNORMAL LOW (ref 36.0–46.0)
Hemoglobin: 11.8 g/dL — ABNORMAL LOW (ref 12.0–15.0)
MCH: 28 pg (ref 26.0–34.0)
MCHC: 34.4 g/dL (ref 30.0–36.0)
MCV: 81.3 fL (ref 78.0–100.0)

## 2012-11-18 LAB — HEPATIC FUNCTION PANEL
Albumin: 4.2 g/dL (ref 3.5–5.2)
Bilirubin, Direct: 0.1 mg/dL (ref 0.0–0.3)
Total Bilirubin: 0.3 mg/dL (ref 0.3–1.2)

## 2012-11-18 NOTE — Telephone Encounter (Signed)
Pt presented to lab; Lab orders placed, forwarded to [facility]

## 2012-11-19 ENCOUNTER — Telehealth: Payer: Self-pay

## 2012-11-19 NOTE — Telephone Encounter (Signed)
Message copied by Eulis Manly on Wed Nov 19, 2012  5:33 PM ------      Message from: Danise Edge A      Created: Wed Nov 19, 2012  1:53 PM       Notify labs look pretty good, mild anemia, recommend increase leafy greens, lean red meat, cook in cast iron and/or start a multivitamin with iron and we will recheck at visit. Only other abnormality is mild elevation of bad cholesterol. Avoid trans fats/partially hydrogenated and minimize simple carbs and saturated fats ------

## 2012-11-19 NOTE — Telephone Encounter (Signed)
Patient informed of results.  

## 2013-01-27 ENCOUNTER — Ambulatory Visit: Payer: Federal, State, Local not specified - PPO | Admitting: Family Medicine

## 2013-09-08 LAB — HM PAP SMEAR: HM Pap smear: NORMAL

## 2013-10-08 ENCOUNTER — Other Ambulatory Visit (HOSPITAL_BASED_OUTPATIENT_CLINIC_OR_DEPARTMENT_OTHER): Payer: Self-pay | Admitting: Obstetrics and Gynecology

## 2013-10-08 DIAGNOSIS — Z139 Encounter for screening, unspecified: Secondary | ICD-10-CM

## 2013-10-13 ENCOUNTER — Encounter: Payer: Self-pay | Admitting: Internal Medicine

## 2013-10-13 ENCOUNTER — Ambulatory Visit (INDEPENDENT_AMBULATORY_CARE_PROVIDER_SITE_OTHER): Payer: Federal, State, Local not specified - PPO | Admitting: Internal Medicine

## 2013-10-13 VITALS — BP 130/77 | HR 93 | Temp 98.1°F | Wt 205.1 lb

## 2013-10-13 DIAGNOSIS — R252 Cramp and spasm: Secondary | ICD-10-CM

## 2013-10-13 DIAGNOSIS — R51 Headache: Secondary | ICD-10-CM

## 2013-10-13 DIAGNOSIS — R519 Headache, unspecified: Secondary | ICD-10-CM

## 2013-10-13 MED ORDER — MELOXICAM 15 MG PO TABS: 15.0000 mg | ORAL_TABLET | Freq: Every day | ORAL | Status: DC | PRN

## 2013-10-13 MED ORDER — CYCLOBENZAPRINE HCL 10 MG PO TABS: 10.0000 mg | ORAL_TABLET | Freq: Every evening | ORAL | Status: DC | PRN

## 2013-10-13 NOTE — Progress Notes (Signed)
Pre visit review using our clinic review tool, if applicable. No additional management support is needed unless otherwise documented below in the visit note. 

## 2013-10-13 NOTE — Progress Notes (Signed)
Subjective:    Patient ID: Marcia Stevenson, female    DOB: 06/29/1955, 58 y.o.   MRN: [id number]  DOS:  10/13/2013 Type of visit - description : acute Interval history: 3 months history of pain at the left calf, at night, described as a cramp. Denies back pain, leg is not red or swollen, no knee pain, no injury or fall. No claudication.  Also having on and off headaches, usually 3 times a week, they last few hours, feels like a band around the head. No associated nausea, photophobia phonophobia. She used to have migraines, they were more intense than current headaches.   ROS See HPI No increased stress  Past Medical History  Diagnosis Date  . Chicken pox as a child  . Measles as a child  . Hyperlipidemia   . Hypertension   . Asthma     cough  . Allergy     cigarette smoke, pollen  . Sickle cell trait   . Obesity, unspecified 11/16/2012  . Insomnia 11/16/2012  . Personal history of colonic polyps 11/16/2012    1 polyp colonoscopy 2012    Past Surgical History  Procedure Laterality Date  . Abdominal hysterectomy  01-08-2009    total  . Gallstones removed  1987  . Knee surgery  1993    left- arthoscopy  . Cesarean section  1980    History   Social History  . Marital Status: Divorced    Spouse Name: N/A    Number of Children: N/A  . Years of Education: N/A   Occupational History  . Not on file.   Social History Main Topics  . Smoking status: Never Smoker   . Smokeless tobacco: Never Used  . Alcohol Use: No  . Drug Use: No  . 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 Not on file   Social History Narrative  . No narrative on file        Medication List       This list is accurate as of: 10/13/13  8:08 PM.  Always use your most recent med list.               albuterol 108 (90 BASE) MCG/ACT inhaler  Commonly known as:  PROVENTIL HFA;VENTOLIN HFA  Inhale 1-2 puffs into the  lungs every 6 (six) hours as needed for wheezing or shortness of breath.     budesonide-formoterol 160-4.5 MCG/ACT inhaler  Commonly known as:  SYMBICORT  Inhale 2 puffs into the lungs 2 (two) times daily.     cyclobenzaprine 10 MG tablet  Commonly known as:  FLEXERIL  Take 1 tablet (10 mg total) by mouth at bedtime as needed for muscle spasms.     lisinopril 10 MG tablet  Commonly known as:  PRINIVIL,ZESTRIL  Take 1 tablet (10 mg total) by mouth daily.     meloxicam 15 MG tablet  Commonly known as:  MOBIC  Take 1 tablet (15 mg total) by mouth daily as needed (headache).     multivitamin tablet  Take 1 tablet by mouth daily.     Vitamin D-3 5000 UNITS Tabs  Take 1 tablet by mouth daily.           Objective:   Physical Exam BP 130/77  Pulse 93  Temp(Src) 98.1 F (36.7 C) (Oral)  Wt 205 lb 2 oz (93.044 kg)  SpO2 97%  General -- alert, well-developed, NAD.  Neck --FROM HEENT-- Not pale.   Extremities-- no pretibial edema bilaterally ; Knees normal to inspection and palpation. Femoral and pedal pulses normal, calves symmetric and not tender Neurologic--  alert & oriented X3. Speech normal, gait appropriate for age, strength symmetric and appropriate for age.  EOMI, PERLA   Psych-- Cognition and judgment appear intact. Cooperative with normal attention span and concentration. No anxious or depressed appearing.       Assessment & Plan:   L calf pain at night, Vascular exam is normal, pain described as a cramp. Will prescribe empiric Flexeril ,Plans to see PCP next month for a physical, symptoms can be reassessed at that time.  HA, Described as "a band around the head",  last few hours, no associated symptoms. tensional?. Recommend Mobic, as needed. If no better will need further eval, see instructions

## 2013-10-13 NOTE — Patient Instructions (Signed)
Take the Flexeril at night as needed for leg cramps  Take meloxicam daily as needed if  Headaches  If the headaches get more intense, severe or persistent let us know otherwise followup with your PCP

## 2013-11-17 ENCOUNTER — Ambulatory Visit (HOSPITAL_BASED_OUTPATIENT_CLINIC_OR_DEPARTMENT_OTHER)
Admission: RE | Admit: 2013-11-17 | Discharge: 2013-11-17 | Disposition: A | Discharge status: Home / Self Care | Payer: Federal, State, Local not specified - PPO | Source: Ambulatory Visit | Attending: Obstetrics and Gynecology | Admitting: Obstetrics and Gynecology

## 2013-11-17 DIAGNOSIS — Z139 Encounter for screening, unspecified: Secondary | ICD-10-CM

## 2013-11-17 DIAGNOSIS — Z1231 Encounter for screening mammogram for malignant neoplasm of breast: Secondary | ICD-10-CM | POA: Insufficient documentation

## 2013-12-01 ENCOUNTER — Encounter: Payer: Self-pay | Admitting: Family Medicine

## 2013-12-01 ENCOUNTER — Other Ambulatory Visit: Payer: Self-pay | Admitting: General Practice

## 2013-12-01 ENCOUNTER — Ambulatory Visit (INDEPENDENT_AMBULATORY_CARE_PROVIDER_SITE_OTHER): Payer: Federal, State, Local not specified - PPO | Admitting: Family Medicine

## 2013-12-01 VITALS — BP 134/84 | HR 95 | Temp 98.5°F | Ht 66.0 in | Wt 210.2 lb

## 2013-12-01 DIAGNOSIS — IMO0001 Reserved for inherently not codable concepts without codable children: Secondary | ICD-10-CM

## 2013-12-01 DIAGNOSIS — Z0000 Encounter for general adult medical examination without abnormal findings: Secondary | ICD-10-CM

## 2013-12-01 DIAGNOSIS — R252 Cramp and spasm: Secondary | ICD-10-CM

## 2013-12-01 DIAGNOSIS — M791 Myalgia: Secondary | ICD-10-CM

## 2013-12-01 DIAGNOSIS — M609 Myositis, unspecified: Secondary | ICD-10-CM

## 2013-12-01 DIAGNOSIS — I10 Essential (primary) hypertension: Secondary | ICD-10-CM | POA: Diagnosis not present

## 2013-12-01 DIAGNOSIS — E785 Hyperlipidemia, unspecified: Secondary | ICD-10-CM

## 2013-12-01 DIAGNOSIS — G4700 Insomnia, unspecified: Secondary | ICD-10-CM

## 2013-12-01 DIAGNOSIS — E669 Obesity, unspecified: Secondary | ICD-10-CM

## 2013-12-01 DIAGNOSIS — M8580 Other specified disorders of bone density and structure, unspecified site: Secondary | ICD-10-CM

## 2013-12-01 MED ORDER — LORCASERIN HCL 10 MG PO TABS: 10.0000 mg | ORAL_TABLET | Freq: Two times a day (BID) | ORAL | Status: DC

## 2013-12-01 MED ORDER — LISINOPRIL 10 MG PO TABS: 10.0000 mg | ORAL_TABLET | Freq: Every day | ORAL | Status: DC

## 2013-12-01 NOTE — Progress Notes (Signed)
Pre visit review using our clinic review tool, if applicable. No additional management support is needed unless otherwise documented below in the visit note. 

## 2013-12-01 NOTE — Patient Instructions (Addendum)
Muscle cramps, need 64 oz of clear fluids, small frequent meals with lean proteins, complex carbs. Hyland's leg cramp medicine  Encouraged good sleep hygiene such as dark, quiet room. No blue/green glowing lights such as computer screens in bedroom. No alcohol or stimulants in evening. Cut down on caffeine as able. Regular exercise is helpful but not just prior to bed time.   L tryptophan for sleep  Insomnia Insomnia is frequent trouble falling and/or staying asleep. Insomnia can be a long term problem or a short term problem. Both are common. Insomnia can be a short term problem when the wakefulness is related to a certain stress or worry. Long term insomnia is often related to ongoing stress during waking hours and/or poor sleeping habits. Overtime, sleep deprivation itself can make the problem worse. Every little thing feels more severe because you are overtired and your ability to cope is decreased. CAUSES   Stress, anxiety, and depression.  Poor sleeping habits.  Distractions such as TV in the bedroom.  Naps close to bedtime.  Engaging in emotionally charged conversations before bed.  Technical reading before sleep.  Alcohol and other sedatives. They may make the problem worse. They can hurt normal sleep patterns and normal dream activity.  Stimulants such as caffeine for several hours prior to bedtime.  Pain syndromes and shortness of breath can cause insomnia.  Exercise late at night.  Changing time zones may cause sleeping problems (jet lag). It is sometimes helpful to have someone observe your sleeping patterns. They should look for periods of not breathing during the night (sleep apnea). They should also look to see how long those periods last. If you live alone or observers are uncertain, you can also be observed at a sleep clinic where your sleep patterns will be professionally monitored. Sleep apnea requires a checkup and treatment. Give your caregivers your medical  history. Give your caregivers observations your family has made about your sleep.  SYMPTOMS   Not feeling rested in the morning.  Anxiety and restlessness at bedtime.  Difficulty falling and staying asleep. TREATMENT   Your caregiver may prescribe treatment for an underlying medical disorders. Your caregiver can give advice or help if you are using alcohol or other drugs for self-medication. Treatment of underlying problems will usually eliminate insomnia problems.  Medications can be prescribed for short time use. They are generally not recommended for lengthy use.  Over-the-counter sleep medicines are not recommended for lengthy use. They can be habit forming.  You can promote easier sleeping by making lifestyle changes such as:  Using relaxation techniques that help with breathing and reduce muscle tension.  Exercising earlier in the day.  Changing your diet and the time of your last meal. No night time snacks.  Establish a regular time to go to bed.  Counseling can help with stressful problems and worry.  Soothing music and white noise may be helpful if there are background noises you cannot remove.  Stop tedious detailed work at least one hour before bedtime. HOME CARE INSTRUCTIONS   Keep a diary. Inform your caregiver about your progress. This includes any medication side effects. See your caregiver regularly. Take note of:  Times when you are asleep.  Times when you are awake during the night.  The quality of your sleep.  How you feel the next day. This information will help your caregiver care for you.  Get out of bed if you are still awake after 15 minutes. Read or do some quiet activity.  Keep the lights down. Wait until you feel sleepy and go back to bed.  Keep regular sleeping and waking hours. Avoid naps.  Exercise regularly.  Avoid distractions at bedtime. Distractions include watching television or engaging in any intense or detailed activity like  attempting to balance the household checkbook.  Develop a bedtime ritual. Keep a familiar routine of bathing, brushing your teeth, climbing into bed at the same time each night, listening to soothing music. Routines increase the success of falling to sleep faster.  Use relaxation techniques. This can be using breathing and muscle tension release routines. It can also include visualizing peaceful scenes. You can also help control troubling or intruding thoughts by keeping your mind occupied with boring or repetitive thoughts like the old concept of counting sheep. You can make it more creative like imagining planting one beautiful flower after another in your backyard garden.  During your day, work to eliminate stress. When this is not possible use some of the previous suggestions to help reduce the anxiety that accompanies stressful situations. MAKE SURE YOU:   Understand these instructions.  Will watch your condition.  Will get help right away if you are not doing well or get worse. Document Released: 12/23/1999 Document Revised: 03/19/2011 Document Reviewed: 01/22/2007 [facility] Patient Information 2015 [employer] [place]. This information is not intended to replace advice given to you by your health care provider. Make sure you discuss any questions you have with your health care provider.  Encouraged good sleep hygiene such as dark, quiet room. No blue/green glowing lights such as computer screens in bedroom. No alcohol or stimulants in evening. Cut down on caffeine as able. Regular exercise is helpful but not just prior to bed time.   DASH Eating Plan DASH stands for "Dietary Approaches to Stop Hypertension." The DASH eating plan is a healthy eating plan that has been shown to reduce high blood pressure (hypertension). Additional health benefits may include reducing the risk of type 2 diabetes mellitus, heart disease, and stroke. The DASH eating plan may also help with weight loss. WHAT DO I  NEED TO KNOW ABOUT THE DASH EATING PLAN? For the DASH eating plan, you will follow these general guidelines:  Choose foods with a percent daily value for sodium of less than 5% (as listed on the food label).  Use salt-free seasonings or herbs instead of table salt or sea salt.  Check with your health care provider or pharmacist before using salt substitutes.  Eat lower-sodium products, often labeled as "lower sodium" or "no salt added."  Eat fresh foods.  Eat more vegetables, fruits, and low-fat dairy products.  Choose whole grains. Look for the word "whole" as the first word in the ingredient list.  Choose fish and skinless chicken or [place] more often than red meat. Limit fish, poultry, and meat to 6 oz (170 g) each day.  Limit sweets, desserts, sugars, and sugary drinks.  Choose heart-healthy fats.  Limit cheese to 1 oz (28 g) per day.  Eat more home-cooked food and less restaurant, buffet, and fast food.  Limit fried foods.  Cook foods using methods other than frying.  Limit canned vegetables. If you do use them, rinse them well to decrease the sodium.  When eating at a restaurant, ask that your food be prepared with less salt, or no salt if possible. WHAT FOODS CAN I EAT? Seek help from a dietitian for individual calorie needs. Grains Whole grain or whole wheat bread. Brown rice. Whole grain or whole  wheat pasta. Quinoa, bulgur, and whole grain cereals. Low-sodium cereals. Corn or whole wheat flour tortillas. Whole grain cornbread. Whole grain crackers. Low-sodium crackers. Vegetables Fresh or frozen vegetables (raw, steamed, roasted, or grilled). Low-sodium or reduced-sodium tomato and vegetable juices. Low-sodium or reduced-sodium tomato sauce and paste. Low-sodium or reduced-sodium canned vegetables.  Fruits All fresh, canned (in natural juice), or frozen fruits. Meat and Other Protein Products Ground beef (85% or leaner), grass-fed beef, or beef trimmed of fat.  Skinless chicken or [place]. Ground chicken or [place]. Pork trimmed of fat. All fish and seafood. Eggs. Dried beans, peas, or lentils. Unsalted nuts and seeds. Unsalted canned beans. Dairy Low-fat dairy products, such as skim or 1% milk, 2% or reduced-fat cheeses, low-fat ricotta or cottage cheese, or plain low-fat yogurt. Low-sodium or reduced-sodium cheeses. Fats and Oils Tub margarines without trans fats. Light or reduced-fat mayonnaise and salad dressings (reduced sodium). Avocado. Safflower, olive, or canola oils. Natural peanut or almond butter. Other Unsalted popcorn and pretzels. The items listed above may not be a complete list of recommended foods or beverages. Contact your dietitian for more options. WHAT FOODS ARE NOT RECOMMENDED? Grains White bread. White pasta. White rice. Refined cornbread. Bagels and croissants. Crackers that contain trans fat. Vegetables Creamed or fried vegetables. Vegetables in a cheese sauce. Regular canned vegetables. Regular canned tomato sauce and paste. Regular tomato and vegetable juices. Fruits Dried fruits. Canned fruit in light or heavy syrup. Fruit juice. Meat and Other Protein Products Fatty cuts of meat. Ribs, chicken wings, bacon, sausage, bologna, salami, chitterlings, fatback, hot dogs, bratwurst, and packaged luncheon meats. Salted nuts and seeds. Canned beans with salt. Dairy Whole or 2% milk, cream, half-and-half, and cream cheese. Whole-fat or sweetened yogurt. Full-fat cheeses or blue cheese. Nondairy creamers and whipped toppings. Processed cheese, cheese spreads, or cheese curds. Condiments Onion and garlic salt, seasoned salt, table salt, and sea salt. Canned and packaged gravies. Worcestershire sauce. Tartar sauce. Barbecue sauce. Teriyaki sauce. Soy sauce, including reduced sodium. Steak sauce. Fish sauce. Oyster sauce. Cocktail sauce. Horseradish. Ketchup and mustard. Meat flavorings and tenderizers. Bouillon cubes. Hot sauce. Tabasco  sauce. Marinades. Taco seasonings. Relishes. Fats and Oils Butter, stick margarine, lard, shortening, ghee, and bacon fat. Coconut, palm kernel, or palm oils. Regular salad dressings. Other Pickles and olives. Salted popcorn and pretzels. The items listed above may not be a complete list of foods and beverages to avoid. Contact your dietitian for more information. WHERE CAN I FIND MORE INFORMATION? National Heart, Lung, and Blood Institute: [employer] Document Released: 12/14/2010 Document Revised: 05/11/2013 Document Reviewed: 10/29/2012 [facility] Patient Information 2015 [place], [place]. This information is not intended to replace advice given to you by your health care provider. Make sure you discuss any questions you have with your health care provider.

## 2013-12-02 LAB — RENAL FUNCTION PANEL
Albumin: 4.3 g/dL (ref 3.5–5.2)
BUN: 11 mg/dL (ref 6–23)
CO2: 25 meq/L (ref 19–32)
CREATININE: 1 mg/dL (ref 0.4–1.2)
Calcium: 9.6 mg/dL (ref 8.4–10.5)
Chloride: 102 mEq/L (ref 96–112)
GFR: 77.45 mL/min (ref >60.00)
GLUCOSE: 85 mg/dL (ref 70–99)
Phosphorus: 3.9 mg/dL (ref 2.3–4.6)
Potassium: 3.9 mEq/L (ref 3.5–5.1)
Sodium: 138 mEq/L (ref 135–145)

## 2013-12-02 LAB — HEPATIC FUNCTION PANEL
ALT: 27 U/L (ref 0–35)
AST: 28 U/L (ref 0–37)
Albumin: 4.3 g/dL (ref 3.5–5.2)
Alkaline Phosphatase: 80 U/L (ref 39–117)
BILIRUBIN TOTAL: 0.3 mg/dL (ref 0.2–1.2)
Bilirubin, Direct: 0 mg/dL (ref 0.0–0.3)
Total Protein: 7.7 g/dL (ref 6.0–8.3)

## 2013-12-02 LAB — LIPID PANEL
Cholesterol: 231 mg/dL — ABNORMAL HIGH (ref 0–200)
HDL: 52.6 mg/dL (ref >39.00)
LDL Cholesterol: 154 mg/dL — ABNORMAL HIGH (ref 0–99)
NONHDL: 178.4
Total CHOL/HDL Ratio: 4
Triglycerides: 121 mg/dL (ref 0.0–149.0)
VLDL: 24.2 mg/dL (ref 0.0–40.0)

## 2013-12-02 LAB — CBC
HEMATOCRIT: 36 % (ref 36.0–46.0)
Hemoglobin: 11.9 g/dL — ABNORMAL LOW (ref 12.0–15.0)
MCHC: 33.2 g/dL (ref 30.0–36.0)
MCV: 83.9 fl (ref 78.0–100.0)
Platelets: 272 10*3/uL (ref 150.0–400.0)
RBC: 4.29 Mil/uL (ref 3.87–5.11)
RDW: 13.5 % (ref 11.5–15.5)
WBC: 6.9 10*3/uL (ref 4.0–10.5)

## 2013-12-02 LAB — MAGNESIUM: MAGNESIUM: 2.2 mg/dL (ref 1.5–2.5)

## 2013-12-02 LAB — TSH: TSH: 2.25 u[IU]/mL (ref 0.35–4.50)

## 2013-12-02 LAB — VITAMIN D 25 HYDROXY (VIT D DEFICIENCY, FRACTURES): VITD: 50.44 ng/mL (ref 30.00–100.00)

## 2013-12-06 ENCOUNTER — Encounter: Payer: Self-pay | Admitting: Family Medicine

## 2013-12-06 DIAGNOSIS — Z0000 Encounter for general adult medical examination without abnormal findings: Secondary | ICD-10-CM

## 2013-12-06 DIAGNOSIS — IMO0001 Reserved for inherently not codable concepts without codable children: Secondary | ICD-10-CM

## 2013-12-06 DIAGNOSIS — E7800 Pure hypercholesterolemia, unspecified: Secondary | ICD-10-CM | POA: Insufficient documentation

## 2013-12-06 HISTORY — DX: Encounter for general adult medical examination without abnormal findings: Z00.00

## 2013-12-06 HISTORY — DX: Reserved for inherently not codable concepts without codable children: IMO0001

## 2013-12-06 NOTE — Progress Notes (Signed)
Marcia GoodellValerie Stevenson  [id number] [birth year] 12/06/2013      Progress Note-Follow Up  Subjective  Chief Complaint  Chief Complaint  Patient presents with  . Annual Exam    physical    HPI  Patient is a 58 y.o. female in today for routine medical care. Patient is here today for annual exam. she is complaining of numerous concerns. She has trouble falling asleep and staying asleep. She has had several sleep studies in the past which were negative for restless leg syndrome and for obstructive sleep apnea. She is noting occasional headaches as well as some muscle cramps. They're worse when she is walking and worse on the left than the right. No illness. Does follow with her gynecologist and was seen there in September of this year. Well controlled, no changes to meds. Encouraged heart healthy diet such as the DASH diet and exercise as tolerated.   Past Medical History  Diagnosis Date  . Chicken pox as a child  . Measles as a child  . Hyperlipidemia   . Hypertension   . Asthma     cough  . Allergy     cigarette smoke, pollen  . Sickle cell trait   . Obesity, unspecified 11/16/2012  . Insomnia 11/16/2012  . Personal history of colonic polyps 11/16/2012    1 polyp colonoscopy 2012  . Preventative health care 12/06/2013    Past Surgical History  Procedure Laterality Date  . Abdominal hysterectomy  01-08-2009    total  . Gallstones removed  1987  . Knee surgery  1993    left- arthoscopy  . Cesarean section  1980    Family History  Problem Relation Age of Onset  . Cancer Mother [id number]    ovarian  . Hypertension Mother   . Deep vein thrombosis Father   . Hypertension Sister   . Gout Sister   . Sickle cell trait Sister   . Asthma Son   . Cataracts Son   . Sickle cell trait Brother   . Sickle cell anemia Brother 24  . Sickle cell trait Brother   . Sickle cell trait Sister   . Sickle cell trait Brother     History   Social History  . Marital Status: Divorced    Spouse Name:  N/A    Number of Children: N/A  . Years of Education: N/A   Occupational History  . Not on file.   Social History Main Topics  . Smoking status: Never Smoker   . Smokeless tobacco: Never Used  . Alcohol Use: No  . Drug Use: No  . 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 Not on file   Social History Narrative    Current Outpatient Prescriptions on File Prior to Visit  Medication Sig Dispense Refill  . albuterol (PROVENTIL HFA;VENTOLIN HFA) 108 (90 BASE) MCG/ACT inhaler Inhale 1-2 puffs into the lungs every 6 (six) hours as needed for wheezing or shortness of breath. 3 Inhaler 1  . budesonide-formoterol (SYMBICORT) 160-4.5 MCG/ACT inhaler Inhale 2 puffs into the lungs 2 (two) times daily. 3 Inhaler 1  . Cholecalciferol (VITAMIN D-3) 5000 UNITS TABS Take 1 tablet by mouth daily.    . cyclobenzaprine (FLEXERIL) 10 MG tablet Take 1 tablet (10 mg total) by mouth at bedtime as needed for muscle spasms. 21 tablet 0  . meloxicam (MOBIC) 15 MG tablet Take 1 tablet (15 mg  total) by mouth daily as needed (headache). 30 tablet 0  . Multiple Vitamin (MULTIVITAMIN) tablet Take 1 tablet by mouth daily.     No current facility-administered medications on file prior to visit.    No Known Allergies  Review of Systems  Review of Systems  Constitutional: Negative for fever, chills and malaise/fatigue.  HENT: Negative for congestion, hearing loss and nosebleeds.   Eyes: Negative for discharge.  Respiratory: Negative for cough, sputum production, shortness of breath and wheezing.   Cardiovascular: Negative for chest pain, palpitations and leg swelling.  Gastrointestinal: Negative for heartburn, nausea, vomiting, abdominal pain, diarrhea, constipation and blood in stool.  Genitourinary: Negative for dysuria, urgency, frequency and hematuria.  Musculoskeletal: Positive for myalgias and joint pain. Negative for back pain and  falls.  Skin: Negative for rash.  Neurological: Negative for dizziness, tremors, sensory change, focal weakness, loss of consciousness, weakness and headaches.  Endo/Heme/Allergies: Negative for polydipsia. Does not bruise/bleed easily.  Psychiatric/Behavioral: Negative for depression and suicidal ideas. The patient has insomnia. The patient is not nervous/anxious.     Objective  BP 134/84 mmHg  Pulse 95  Temp(Src) 98.5 F (36.9 C) (Oral)  Ht 5\' 6"  (1.676 m)  Wt 210 lb 3.2 oz (95.346 kg)  BMI 33.94 kg/m2  SpO2 98%  Physical Exam  Physical Exam  Constitutional: She is oriented to person, place, and time and well-developed, well-nourished, and in no distress. No distress.  HENT:  Head: Normocephalic and atraumatic.  Right Ear: External ear normal.  Left Ear: External ear normal.  Nose: Nose normal.  Mouth/Throat: Oropharynx is clear and moist. No oropharyngeal exudate.  Eyes: Conjunctivae are normal. Pupils are equal, round, and reactive to light. Right eye exhibits no discharge. Left eye exhibits no discharge. No scleral icterus.  Neck: Normal range of motion. Neck supple. No thyromegaly present.  Cardiovascular: Normal rate, regular rhythm, normal heart sounds and intact distal pulses.   No murmur heard. Pulmonary/Chest: Effort normal and breath sounds normal. No respiratory distress. She has no wheezes. She has no rales.  Abdominal: Soft. Bowel sounds are normal. She exhibits no distension and no mass. There is no tenderness.  Musculoskeletal: Normal range of motion. She exhibits no edema or tenderness.  Lymphadenopathy:    She has no cervical adenopathy.  Neurological: She is alert and oriented to person, place, and time. She has normal reflexes. No cranial nerve deficit. Coordination normal.  Skin: Skin is warm and dry. No rash noted. She is not diaphoretic.  Psychiatric: Mood, memory and affect normal.    Lab Results  Component Value Date   TSH 2.25 12/01/2013   Lab  Results  Component Value Date   WBC 6.9 12/01/2013   HGB 11.9* 12/01/2013   HCT 36.0 12/01/2013   MCV 83.9 12/01/2013   PLT 272.0 12/01/2013   Lab Results  Component Value Date   CREATININE 1.0 12/01/2013   BUN 11 12/01/2013   NA 138 12/01/2013   K 3.9 12/01/2013   CL 102 12/01/2013   CO2 25 12/01/2013   Lab Results  Component Value Date   ALT 27 12/01/2013   AST 28 12/01/2013   ALKPHOS 80 12/01/2013   BILITOT 0.3 12/01/2013   Lab Results  Component Value Date   CHOL 231* 12/01/2013   Lab Results  Component Value Date   HDL 52.60 12/01/2013   Lab Results  Component Value Date   LDLCALC 154* 12/01/2013   Lab Results  Component Value Date   TRIG 121.0  12/01/2013   Lab Results  Component Value Date   CHOLHDL 4 12/01/2013     Assessment & Plan  Hypertension Well controlled, no changes to meds. Encouraged heart healthy diet such as the DASH diet and exercise as tolerated.   Insomnia Encouraged good sleep hygiene such as dark, quiet room. No blue/green glowing lights such as computer screens in bedroom. No alcohol or stimulants in evening. Cut down on caffeine as able. Regular exercise is helpful but not just prior to bed time.   Obesity Encouraged DASH diet, decrease po intake and increase exercise as tolerated. Needs 7-8 hours of sleep nightly. Avoid trans fats, eat small, frequent meals every 4-5 hours with lean proteins, complex carbs and healthy fats. Minimize simple carbs, GMO foods.  Preventative health care Patient encouraged to maintain heart healthy diet, regular exercise, adequate sleep. Consider daily probiotics. Take medications as prescribed. Reviewed annual labs  Hyperlipidemia Encouraged heart healthy diet, increase exercise, avoid trans fats, consider a krill oil cap daily  Myalgia and myositis Encouraged increased hydration, magnesium supplements and Hyland's leg cramp med.

## 2013-12-06 NOTE — Assessment & Plan Note (Signed)
Encouraged DASH diet, decrease po intake and increase exercise as tolerated. Needs 7-8 hours of sleep nightly. Avoid trans fats, eat small, frequent meals every 4-5 hours with lean proteins, complex carbs and healthy fats. Minimize simple carbs, GMO foods. 

## 2013-12-06 NOTE — Assessment & Plan Note (Signed)
Encouraged increased hydration, magnesium supplements and Hyland's leg cramp med.

## 2013-12-06 NOTE — Assessment & Plan Note (Addendum)
Patient encouraged to maintain heart healthy diet, regular exercise, adequate sleep. Consider daily probiotics. Take medications as prescribed. Reviewed annual labs 

## 2013-12-06 NOTE — Assessment & Plan Note (Signed)
Well controlled, no changes to meds. Encouraged heart healthy diet such as the DASH diet and exercise as tolerated.  °

## 2013-12-06 NOTE — Assessment & Plan Note (Signed)
Encouraged good sleep hygiene such as dark, quiet room. No blue/green glowing lights such as computer screens in bedroom. No alcohol or stimulants in evening. Cut down on caffeine as able. Regular exercise is helpful but not just prior to bed time.  

## 2013-12-06 NOTE — Assessment & Plan Note (Signed)
Encouraged heart healthy diet, increase exercise, avoid trans fats, consider a krill oil cap daily 

## 2014-02-20 ENCOUNTER — Telehealth: Payer: Self-pay | Admitting: Family Medicine

## 2014-02-20 NOTE — Telephone Encounter (Signed)
[facility] Night - Client TELEPHONE ADVICE RECORD [facility] Patient Name: Lorrin GoodellVALERIE YOUNG DOB: 03-Aug-1955 Initial Comment Caller states her legs are hurting and she does have some meds for muscle relaxation. Nurse Assessment Nurse: Chrys RacerKoenig, RN, Alexia FreestoneAnna Marie Date/Time Lamount Cohen(Eastern Time): 02/20/2014 12:03:23 PM Confirm and document reason for call. If symptomatic, describe symptoms. ---Caller states her legs are hurting and she does have some meds for muscle relaxation. Has the patient traveled out of the country within the last 30 days? ---No Does the patient require triage? ---Yes Related visit to physician within the last 2 weeks? ---No Does the PT have any chronic conditions? (i.e. diabetes, asthma, etc.) ---Yes List chronic conditions. ---high blood pressure, asthma Guidelines Guideline Title Affirmed Question Affirmed Notes Leg Pain [1] Leg pain which occurs after walking a certain distance AND [2] disappears with rest AND [3] age > 1850 Final Disposition User See PCP When Office is Open (within 3 days) Chrys RacerKoenig, RN, Alexia FreestoneAnna Marie Comments Appt time w Dr Drue NovelPaz on Monday at 2:45, pt aware

## 2014-02-22 ENCOUNTER — Ambulatory Visit: Payer: Self-pay | Admitting: Internal Medicine

## 2014-02-22 ENCOUNTER — Encounter: Payer: Self-pay | Admitting: Internal Medicine

## 2014-02-22 ENCOUNTER — Ambulatory Visit (INDEPENDENT_AMBULATORY_CARE_PROVIDER_SITE_OTHER): Payer: Federal, State, Local not specified - PPO | Admitting: Internal Medicine

## 2014-02-22 VITALS — BP 137/76 | HR 104 | Temp 97.9°F | Wt 212.2 lb

## 2014-02-22 DIAGNOSIS — IMO0001 Reserved for inherently not codable concepts without codable children: Secondary | ICD-10-CM

## 2014-02-22 DIAGNOSIS — M609 Myositis, unspecified: Secondary | ICD-10-CM

## 2014-02-22 DIAGNOSIS — M791 Myalgia: Secondary | ICD-10-CM

## 2014-02-22 MED ORDER — METHOCARBAMOL 750 MG PO TABS: 750.0000 mg | ORAL_TABLET | Freq: Three times a day (TID) | ORAL | Status: DC | PRN

## 2014-02-22 NOTE — Progress Notes (Signed)
Subjective:    Patient ID: Marcia GoodellValerie Young, female    DOB: [birth year], 59 y.o.   MRN: [id number]  DOS:  02/22/2014 Type of visit - description : acute Interval history: Ongoing pain at the left leg. Symptoms started 6 or 7 months ago, initially described as a cramp at the calf (left), now is more described as a pulling from the calf with some radiation upward to the posterior tight. Pain is definitely worse at night, increased when she walks, decrease when she sits down. No injury or fall Flexeril is not helping.  essentially no symptoms on the right leg.    Review of Systems  denies rash, fever chills No motor deficits, back pain or bladder or bowel incontinence  Past Medical History  Diagnosis Date  . Chicken pox as a child  . Measles as a child  . Hyperlipidemia   . Hypertension   . Asthma     cough  . Allergy     cigarette smoke, pollen  . Sickle cell trait   . Obesity, unspecified 11/16/2012  . Insomnia 11/16/2012  . Personal history of colonic polyps 11/16/2012    1 polyp colonoscopy 2012  . Preventative health care 12/06/2013  . Myalgia and myositis 12/06/2013    Past Surgical History  Procedure Laterality Date  . Abdominal hysterectomy  01-08-2009    total  . Gallstones removed  1987  . Knee surgery  1993    left- arthoscopy  . Cesarean section  1980    History   Social History  . Marital Status: Divorced    Spouse Name: N/A  . Number of Children: N/A  . Years of Education: N/A   Occupational History  . Not on file.   Social History Main Topics  . Smoking status: Never Smoker   . Smokeless tobacco: Never Used  . Alcohol Use: No  . Drug Use: No  . 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 Not on file   Social History Narrative        Medication List       This list is accurate as of: 02/22/14  6:41 PM.  Always use your most recent med list.               albuterol 108 (90 BASE) MCG/ACT inhaler  Commonly known as:  PROVENTIL HFA;VENTOLIN HFA  Inhale 1-2 puffs into the lungs every 6 (six) hours as needed for wheezing or shortness of breath.     budesonide-formoterol 160-4.5 MCG/ACT inhaler  Commonly known as:  SYMBICORT  Inhale 2 puffs into the lungs 2 (two) times daily.     lisinopril 10 MG tablet  Commonly known as:  PRINIVIL,ZESTRIL  Take 1 tablet (10 mg total) by mouth daily.     Lorcaserin HCl 10 MG Tabs  Commonly known as:  BELVIQ  Take 10 mg by mouth 2 (two) times daily.     meloxicam 15 MG tablet  Commonly known as:  MOBIC  Take 1 tablet (15 mg total) by mouth daily as needed (headache).     methocarbamol 750 MG tablet  Commonly known as:  ROBAXIN-750  Take 1 tablet (750 mg total) by mouth every 8 (eight) hours as needed for muscle spasms.     multivitamin tablet  Take 1 tablet by mouth daily.     Vitamin D-3 5000 UNITS Tabs  Take 1 tablet by  mouth daily.           Objective:   Physical Exam BP 137/76 mmHg  Pulse 104  Temp(Src) 97.9 F (36.6 C) (Oral)  Wt 212 lb 4 oz (96.276 kg)  SpO2 95% General:   Well developed, well nourished . NAD.  HEENT:  Normocephalic . Face symmetric, atraumatic Muscle skeletal: no pretibial edema bilaterally  No TTP at the lower back. Calves symmetric and not tender. Normal pedal pulses, warm toes. Skin: Not pale. Not jaundice.  Neurologic:  alert & oriented X3.  Speech normal, gait appropriate for age and unassisted DTRs symmetric, motor symmetric, pinprick examination of the lower extremities normal. Psych--  Cognition and judgment appear intact.  Cooperative with normal attention span and concentration.  Behavior appropriate. No anxious or depressed appearing.       Assessment & Plan:

## 2014-02-22 NOTE — Patient Instructions (Signed)
Get your blood work before you leave    Take the new muscle relaxant, Robaxin up to 3 times a day, watch for drowsiness  We will call you with the results of other tests

## 2014-02-22 NOTE — Progress Notes (Signed)
Pre visit review using our clinic review tool, if applicable. No additional management support is needed unless otherwise documented below in the visit note. 

## 2014-02-22 NOTE — Assessment & Plan Note (Signed)
About 6 months history of pain at the left leg, this time located at the calf and radiates upwards. Etiology unclear, could be a radiculopathy. Plan:  Labs Nerve conduction study Discontinue Flexeril, try   Robaxin, watch for drowsiness Further advice for results

## 2014-02-23 ENCOUNTER — Telehealth: Payer: Self-pay

## 2014-02-23 DIAGNOSIS — R252 Cramp and spasm: Secondary | ICD-10-CM

## 2014-02-23 LAB — IRON: Iron: 60 ug/dL (ref 42–145)

## 2014-02-23 LAB — HEMOGLOBIN A1C
Hgb A1c MFr Bld: 5.7 % — ABNORMAL HIGH (ref <5.7)
MEAN PLASMA GLUCOSE: 117 mg/dL — AB (ref <117)

## 2014-02-23 LAB — VITAMIN B12: Vitamin B-12: 827 pg/mL (ref 211–911)

## 2014-02-23 LAB — FERRITIN: Ferritin: 83 ng/mL (ref 10–291)

## 2014-02-23 LAB — FOLATE: Folate: 19 ng/mL

## 2014-02-23 NOTE — Telephone Encounter (Signed)
Hopefully I did this correctly, option not given for which area of body for study. Lower extremity placed in comments section.

## 2014-02-23 NOTE — Telephone Encounter (Signed)
-----   Message from Wanda PlumpJose E Paz, MD sent at 02/23/2014  1:18 PM EST ----- Regarding: RE: NCS? [facility]? Could you edit  the order ? needs to be of the lower extremity If you can't edit, let me know.  ----- Message -----    From: Dorette GrateKaylyn C Faulkner, CMA    Sent: 02/23/2014  10:47 AM      To: Wanda PlumpJose E Paz, MD Subject: NCS? [facility]?                             FYI.   ----- Message -----    From: Oneal GroutJennifer S Sebastian    Sent: 02/23/2014  10:42 AM      To: Dorette GrateKaylyn C Faulkner, CMA  Hey! Can you please put make order for NCS to be preformed at [facility], need to also add which body part. Thanks

## 2014-02-24 LAB — VITAMIN D 25 HYDROXY (VIT D DEFICIENCY, FRACTURES): Vit D, 25-Hydroxy: 47 ng/mL (ref 30–100)

## 2014-03-01 ENCOUNTER — Telehealth: Payer: Self-pay | Admitting: Family Medicine

## 2014-03-01 NOTE — Telephone Encounter (Signed)
Spoke with Pt, informed her letter was placed in mail regarding labs on Friday (02/26/2014), informed her that labs were normal. Pt questioned status of nerve conduction test, informed her that it looks like it was ordered on Friday as well, that Neurology should be calling her to schedule in the next several days. Pt verbalized understanding.

## 2014-03-01 NOTE — Telephone Encounter (Signed)
Caller name: Marcia Stevenson, Jenesis Relation to pt: self  Call back number: [phone] Pharmacy:  Reason for call:  Pt in need of clarification regarding lab results. Pt did not receive letter. Please advise.

## 2014-03-09 ENCOUNTER — Telehealth: Payer: Self-pay | Admitting: Family Medicine

## 2014-03-09 DIAGNOSIS — IMO0001 Reserved for inherently not codable concepts without codable children: Secondary | ICD-10-CM

## 2014-03-09 NOTE — Telephone Encounter (Signed)
Referral entered for Nerve Conduction testing.

## 2014-03-12 ENCOUNTER — Telehealth: Payer: Self-pay | Admitting: Family Medicine

## 2014-03-12 NOTE — Telephone Encounter (Signed)
Please advise. I'm unfamiliar with Nerve Conduction Study Processes?

## 2014-03-12 NOTE — Telephone Encounter (Signed)
Caller name: Vikki PortsValerie Relationship to patient:self Can be reached: [phone] Pharmacy: NA  Reason for call: Patient is scheduled to have a nerve conduction study done 3/24, she is requesting call back today when more information about this testing and if she needs to do anything to be prepared for this, and also who she should expect to get the results from. Patient needs a call back today since she will be leaving for a 10-day cruise tomorrow

## 2014-03-14 NOTE — Telephone Encounter (Signed)
The nerve conduction study will apply small electrical impulses to her nerves to confirm if her pain is caused by nerve problem such as impingement or pinching. This will help us to targer her treatment. She will hear from our office about results whomever sees results first.

## 2014-03-15 NOTE — Telephone Encounter (Signed)
Called left msg. To call back 

## 2014-03-15 NOTE — Telephone Encounter (Signed)
Called the patient left a message on both cell and home number to call back

## 2014-03-16 NOTE — Telephone Encounter (Signed)
Called the patient no answer on either cell or home number.  Left a detailed message of PCP response to her question on both cell and home number.

## 2014-03-22 ENCOUNTER — Telehealth: Payer: Self-pay | Admitting: Family Medicine

## 2014-03-22 NOTE — Telephone Encounter (Signed)
Caller name: Lorrin GoodellYoung, Suzzanne Relation to pt: self  Call back number: [phone]   Reason for call:  Pt states methocarbamol (ROBAXIN-750) 750 MG tablet is not working. Pt states lidocaine medication works requesting rx.

## 2014-03-22 NOTE — Telephone Encounter (Signed)
Please advise 

## 2014-03-22 NOTE — Telephone Encounter (Signed)
I don't recall prescribing lidocaine to the patient. Recommend to get the nerve conduction study done and follow-up by PCP for further eval.

## 2014-03-23 NOTE — Telephone Encounter (Signed)
Called the patient informed of PCP and provider (Dr. Drue NovelPaz) seen by this patient on 02/22/14. The patient states she tried her friends lidocaine patch and worked really well to relieve her pain. She would like the Dr. Drue NovelPaz to prescribe since he did see her on 02/22/14. The patient is very upset and would like a call back from the physician that did see her on 02/22/14 and the office manager.

## 2014-03-23 NOTE — Telephone Encounter (Signed)
Please transfer phone call to management. I cannot prescribed lidocaine. Needs nerve conduction study

## 2014-03-23 NOTE — Telephone Encounter (Signed)
I have not prescribed Lidocaine for her. If she would like to proceed she will need an appt with me to document need. Insurance is very bad at paying for this medication

## 2014-03-23 NOTE — Telephone Encounter (Signed)
Please read previous note by Dr. Drue NovelPaz, me and Dr. Abner GreenspanBlyth, Pt Marcia Stevenson NEED to be seen by Dr. Abner GreenspanBlyth, her PCP for further evaluation. Dr. Abner GreenspanBlyth has never prescribed Lidocaine to this Pt.

## 2014-03-23 NOTE — Telephone Encounter (Signed)
FYI. Dr. Abner GreenspanBlyth Patient.

## 2014-03-23 NOTE — Telephone Encounter (Addendum)
Pt was seen by Dr. Drue NovelPaz 02/22/2014 for leg pain and stated the medication MD prescribed did not work and requesting lidocaine. Pt also mentioned nerve condition study appointment is 04/01/14 and she is in pain and would like a follow up call regarding an alternate medication. (pt stated a friend gave her lidocaine and it worked) please advise.

## 2014-03-23 NOTE — Telephone Encounter (Signed)
Per Dr. Drue NovelPaz AND Dr. Abner GreenspanBlyth, she will need to see Dr. Abner GreenspanBlyth, neither doctor has prescribed that medication before.

## 2014-03-24 NOTE — Telephone Encounter (Signed)
Pt was contacted regarding scheduling appointment with PCP Dr. Abner GreenspanBlyth pt denied and stated she has a upcoming nerve condition study appointment and all she needs is pain medication to hold her over until appointment.

## 2014-03-24 NOTE — Telephone Encounter (Signed)
Spoke with patient and explained that she would need to see Dr. Abner GreenspanBlyth, her PCP, in order to get any prescriptions. Patient voiced understand and declined to follow up with anyone until after her nerve study

## 2014-04-01 ENCOUNTER — Ambulatory Visit (INDEPENDENT_AMBULATORY_CARE_PROVIDER_SITE_OTHER): Payer: Federal, State, Local not specified - PPO | Admitting: Neurology

## 2014-04-01 DIAGNOSIS — R202 Paresthesia of skin: Secondary | ICD-10-CM

## 2014-04-01 DIAGNOSIS — IMO0001 Reserved for inherently not codable concepts without codable children: Secondary | ICD-10-CM

## 2014-04-01 DIAGNOSIS — R252 Cramp and spasm: Secondary | ICD-10-CM

## 2014-04-01 NOTE — Procedures (Signed)
[facility]  [address] [place], [address]  [place], [place] [address] Tel: [phone] Fax:  [phone] Test Date:  04/01/2014  Patient: Marcia Stevenson DOB: Jan 19, 1955 Physician: Nita Sickle, DO  Sex: Female Height:  Ref Phys: Joaquin Courts  ID#: [id number] Temp: 34.2C Technician: Ala Bent R. NCS T.   Patient Complaints: Patient is a21 year old female here for evaluation of leg/calf cramps worse on the left.  NCV & EMG Findings: Extensive electrodiagnostic testing of the left lower extremity and additional studies of the right shows:  1. Bilateral sural and superficial peroneal sensory responses are within normal limits. 2. Bilateral tibial motor responses at the popliteal fossa are reduced compared to the distal responses, and most likely technical in nature due to patient's physiognomy.  Bilateral peroneal motor responses are within normal limits.  3. There is no evidence of active or chronic motor axon loss changes affecting any of the tested muscles. Motor unit configuration and recruitment pattern is normal.   Impression: This is a normal study. In particular, there is no evidence of a generalized sensorimotor polyneuropathy, myopathy, or lumbosacral radiculopathy affecting the left lower extremity.   ___________________________ Nita Sickle, DO    Nerve Conduction Studies Anti Sensory Summary Table   Site NR Peak (ms) Norm Peak (ms) P-T Amp (V) Norm P-T Amp  Left Sup Peroneal Anti Sensory (Ant Lat Mall)  12 cm    2.7 <4.6 6.5 >4  Right Sup Peroneal Anti Sensory (Ant Lat Mall)  12 cm    2.9 <4.6 6.4 >4  Left Sural Anti Sensory (Lat Mall)  Calf    4.0 <4.6 7.7 >4  Right Sural Anti Sensory (Lat Mall)  Site 4    3.5  8.5    Motor Summary Table   Site NR Onset (ms) Norm Onset (ms) O-P Amp (mV) Norm O-P Amp Site1 Site2 Delta-0 (ms) Dist (cm) Vel (m/s) Norm Vel (m/s)  Left Peroneal Motor (Ext Dig Brev)  Ankle    2.8 <6.0 4.5 >2.5 B Fib Ankle 7.7 36.5  47 >40  B Fib    10.5  4.1  Poplt B Fib 1.1 7.0 64 >40  Poplt    11.6  4.0         Right Peroneal Motor (Ext Dig Brev)  Ankle    2.7 <6.0 6.6 >2.5 B Fib Ankle 7.7 33.0 43 >40  B Fib    10.4  5.9  Poplt B Fib 1.8 10.0 56 >40  Poplt    12.2  5.5         Left Peroneal TA Motor (Tib Ant)  Fib Head    2.1 <4.5 3.7 >3 Poplit Fib Head 1.3 7.0 54 >40  Poplit    3.4  3.5         Right Peroneal TA Motor (Tib Ant)  Fib Head    4.1 <4.5 4.4 >3 Poplit Fib Head 1.1 7.0 64 >40  Poplit    5.2  4.4         Left Tibial Motor (Abd Hall Brev)    body habitus behind knee  Ankle    3.4 <6.0 11.7 >4 Knee Ankle 8.6 42.0 49 >40  Knee    12.0  3.6         Right Tibial Motor (Abd Hall Brev)  Ankle    3.6 <6.0 8.5 >4 Knee Ankle 9.2 42.0 46 >40  Knee    12.8  4.8  EMG   Side Muscle Ins Act Fibs Psw Fasc Number Recrt Dur Dur. Amp Amp. Poly Poly. Comment  Left AntTibialis Nml Nml Nml Nml Nml Nml Nml Nml Nml Nml Nml Nml N/A  Left Gastroc Nml Nml Nml Nml Nml Nml Nml Nml Nml Nml Nml Nml N/A  Left Flex Dig Long Nml Nml Nml Nml Nml Nml Nml Nml Nml Nml Nml Nml N/A  Left RectFemoris Nml Nml Nml Nml Nml Nml Nml Nml Nml Nml Nml Nml N/A  Left GluteusMed Nml Nml Nml Nml Nml Nml Nml Nml Nml Nml Nml Nml N/A  Left BicepsFemS Nml Nml Nml Nml Nml Nml Nml Nml Nml Nml Nml Nml N/A  Left Semimembranosus Nml Nml Nml Nml Nml Nml Nml Nml Nml Nml Nml Nml N/A      Waveforms:

## 2014-04-04 ENCOUNTER — Telehealth: Payer: Self-pay | Admitting: Internal Medicine

## 2014-04-04 MED ORDER — PRAMIPEXOLE DIHYDROCHLORIDE 0.125 MG PO TABS: 0.2500 mg | ORAL_TABLET | Freq: Every day | ORAL | Status: DC

## 2014-04-04 NOTE — Telephone Encounter (Signed)
Advise patient, nerve conduction study is  normal, she may have a condition called restless leg syndrome. Recommend trial with Mirapex, I sent a prescription sig. "2 tablets at bedtime"  but I recommend her to start w/  1 tablet two hours before bedtime, okay to go to 2 tablets one week later  if that is not helping. Follow-up with PCP in one month

## 2014-04-05 NOTE — Telephone Encounter (Signed)
MG of Mirapex?

## 2014-04-05 NOTE — Telephone Encounter (Signed)
rx sent already 

## 2014-04-05 NOTE — Telephone Encounter (Signed)
LMOM informing Pt of NCS results, informed her of Dr. Drue NovelPaz recommendations. In[facility] her [facility]: Informed her recommended way of taking medication and to F/U with Dr. Abner GreenspanBlyth in 1 month.

## 2014-04-12 ENCOUNTER — Telehealth: Payer: Self-pay | Admitting: Family Medicine

## 2014-04-12 NOTE — Telephone Encounter (Signed)
Caller name: Vona Relation to pt: self Call back number: [phone] Pharmacy:  Reason for call:   Patient is requesting a copy of the nerve study test to be mailed to her

## 2014-04-13 NOTE — Telephone Encounter (Signed)
Nerve conduction study results printed and mailed to Pt.

## 2014-04-22 DIAGNOSIS — M5416 Radiculopathy, lumbar region: Secondary | ICD-10-CM | POA: Insufficient documentation

## 2014-05-25 ENCOUNTER — Other Ambulatory Visit: Payer: Self-pay | Admitting: Family Medicine

## 2014-05-26 DIAGNOSIS — M5136 Other intervertebral disc degeneration, lumbar region: Secondary | ICD-10-CM | POA: Insufficient documentation

## 2014-05-26 DIAGNOSIS — M51369 Other intervertebral disc degeneration, lumbar region without mention of lumbar back pain or lower extremity pain: Secondary | ICD-10-CM | POA: Insufficient documentation

## 2014-05-26 DIAGNOSIS — M79604 Pain in right leg: Secondary | ICD-10-CM | POA: Insufficient documentation

## 2014-05-26 DIAGNOSIS — M79605 Pain in left leg: Secondary | ICD-10-CM

## 2014-08-11 DIAGNOSIS — J3089 Other allergic rhinitis: Secondary | ICD-10-CM | POA: Insufficient documentation

## 2014-08-11 DIAGNOSIS — J309 Allergic rhinitis, unspecified: Secondary | ICD-10-CM | POA: Insufficient documentation

## 2014-10-19 ENCOUNTER — Other Ambulatory Visit (HOSPITAL_BASED_OUTPATIENT_CLINIC_OR_DEPARTMENT_OTHER): Payer: Self-pay | Admitting: Internal Medicine

## 2014-10-19 DIAGNOSIS — Z1231 Encounter for screening mammogram for malignant neoplasm of breast: Secondary | ICD-10-CM

## 2014-11-25 ENCOUNTER — Ambulatory Visit (HOSPITAL_BASED_OUTPATIENT_CLINIC_OR_DEPARTMENT_OTHER)
Admission: RE | Admit: 2014-11-25 | Discharge: 2014-11-25 | Disposition: A | Discharge status: Home / Self Care | Payer: Federal, State, Local not specified - PPO | Source: Ambulatory Visit | Attending: Internal Medicine | Admitting: Internal Medicine

## 2014-11-25 DIAGNOSIS — Z1231 Encounter for screening mammogram for malignant neoplasm of breast: Secondary | ICD-10-CM

## 2014-11-25 IMAGING — MG MM DIGITAL SCREENING BILATERAL
5 series · 5 of 5 positions shown · non-contrast
Comparison: Previous exam(s).

CLINICAL DATA: Screening.

EXAM:
DIGITAL SCREENING BILATERAL MAMMOGRAM WITH CAD

[R XCCL]
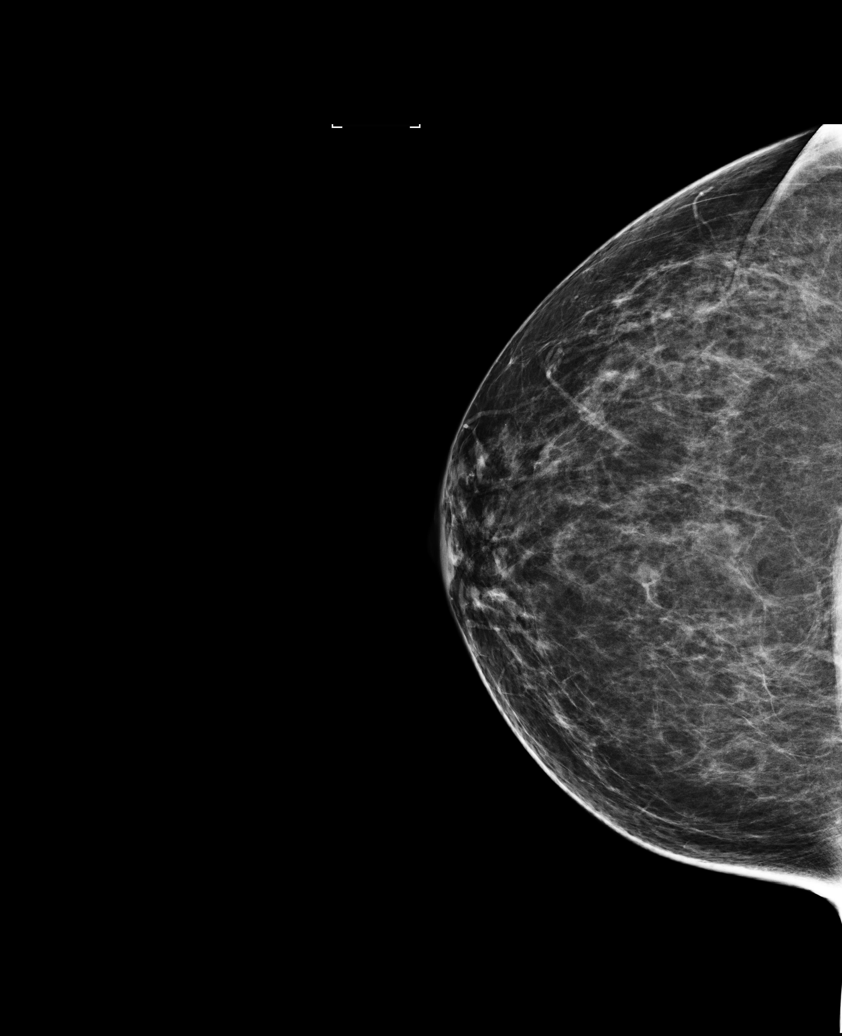

[R MLO]
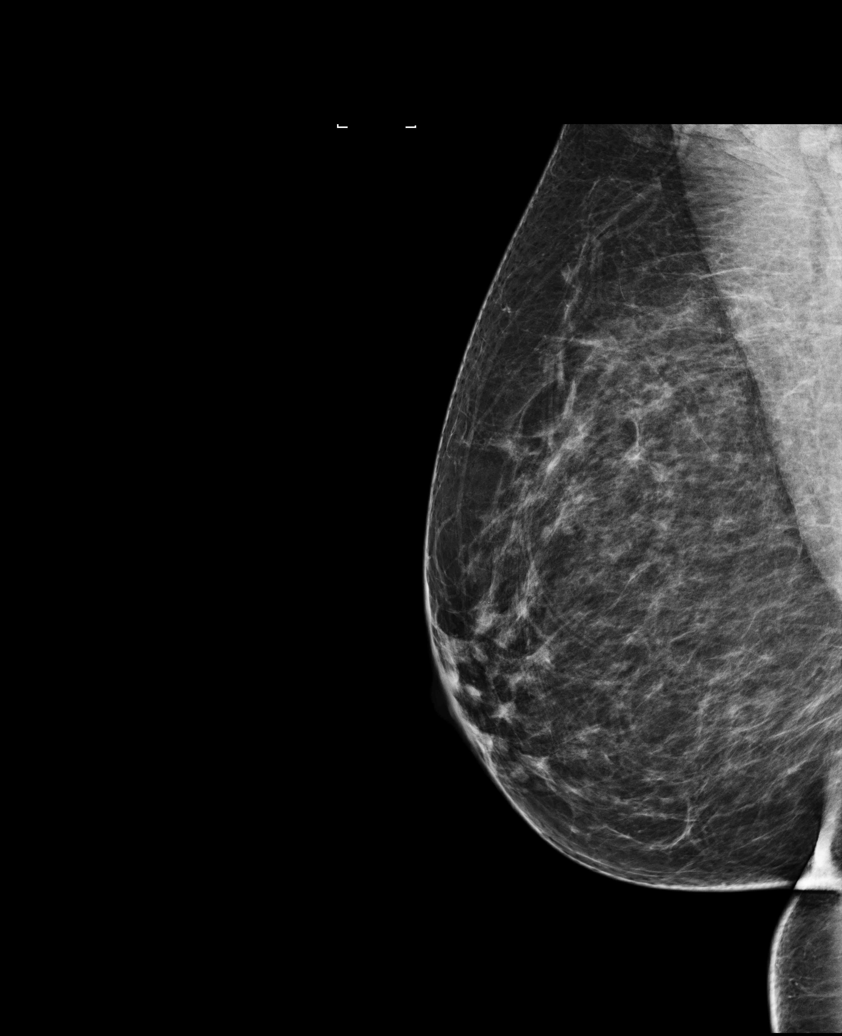

[L MLO]
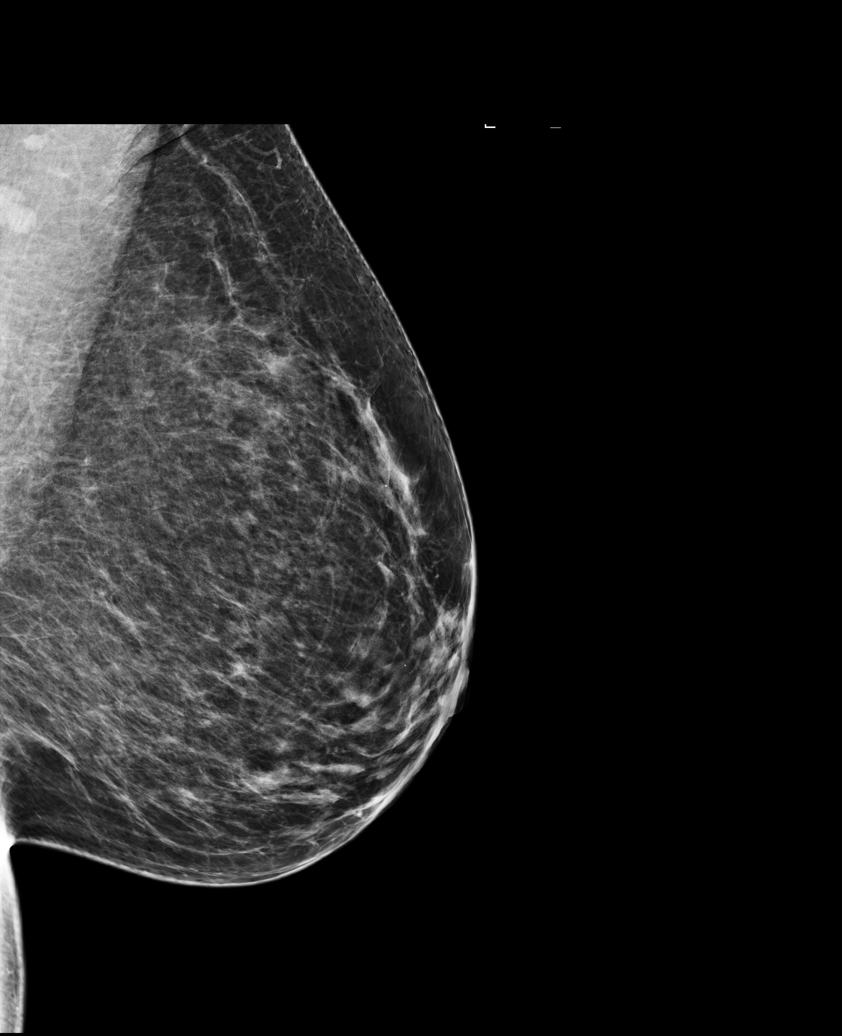

[R CC]
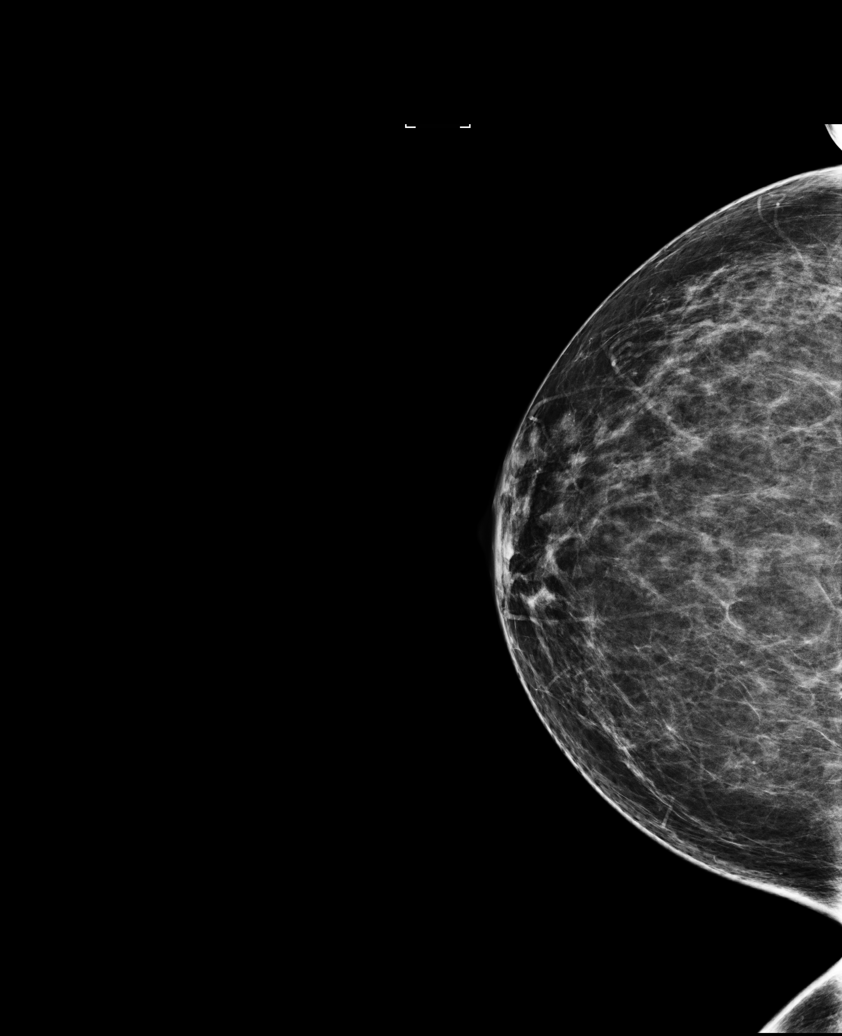

[L CC]
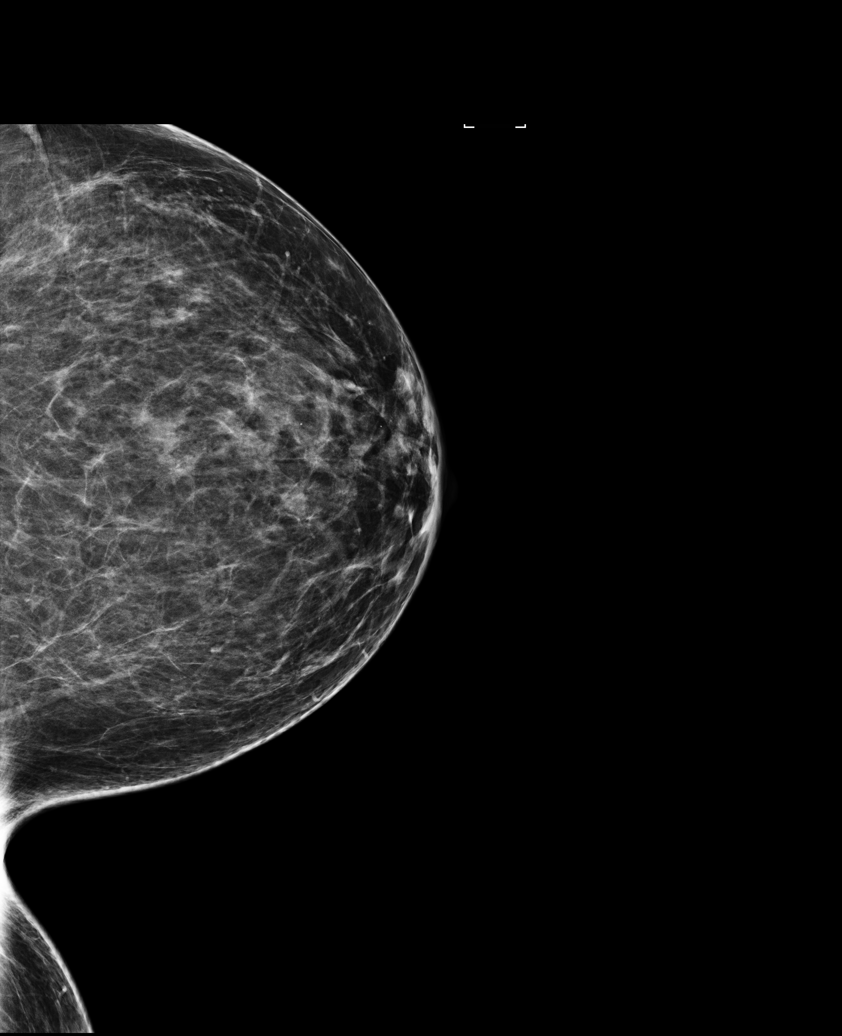

[5 of 5 positions shown; findings below may reference images not displayed]

ACR Breast Density Category b: There are scattered areas of
fibroglandular density.
FINDINGS: There are no findings suspicious for malignancy. Images were
processed with CAD.
IMPRESSION: No mammographic evidence of malignancy. A result letter of this
screening mammogram will be mailed directly to the patient.

RECOMMENDATION:
Screening mammogram in one year. (Code:AS-G-LCT)

BI-RADS CATEGORY  1: Negative.

## 2015-01-24 DIAGNOSIS — L819 Disorder of pigmentation, unspecified: Secondary | ICD-10-CM | POA: Insufficient documentation

## 2015-02-07 DIAGNOSIS — K648 Other hemorrhoids: Secondary | ICD-10-CM | POA: Insufficient documentation

## 2015-06-16 ENCOUNTER — Ambulatory Visit (INDEPENDENT_AMBULATORY_CARE_PROVIDER_SITE_OTHER): Payer: Federal, State, Local not specified - PPO | Admitting: Pediatrics

## 2015-06-16 ENCOUNTER — Encounter: Payer: Self-pay | Admitting: Pediatrics

## 2015-06-16 VITALS — BP 138/80 | HR 102 | Temp 98.3°F | Resp 20 | Ht 66.0 in | Wt 211.0 lb

## 2015-06-16 DIAGNOSIS — J4541 Moderate persistent asthma with (acute) exacerbation: Secondary | ICD-10-CM | POA: Diagnosis not present

## 2015-06-16 DIAGNOSIS — J4540 Moderate persistent asthma, uncomplicated: Secondary | ICD-10-CM | POA: Insufficient documentation

## 2015-06-16 DIAGNOSIS — J301 Allergic rhinitis due to pollen: Secondary | ICD-10-CM

## 2015-06-16 DIAGNOSIS — I10 Essential (primary) hypertension: Secondary | ICD-10-CM | POA: Diagnosis not present

## 2015-06-16 MED ORDER — BUDESONIDE-FORMOTEROL FUMARATE 160-4.5 MCG/ACT IN AERO: 2.0000 | INHALATION_SPRAY | Freq: Two times a day (BID) | RESPIRATORY_TRACT | Status: DC

## 2015-06-16 MED ORDER — MONTELUKAST SODIUM 10 MG PO TABS: 10.0000 mg | ORAL_TABLET | Freq: Every day | ORAL | Status: DC

## 2015-06-16 MED ORDER — FLUTICASONE PROPIONATE 50 MCG/ACT NA SUSP: 2.0000 | Freq: Every day | NASAL | Status: DC

## 2015-06-16 MED ORDER — ALBUTEROL SULFATE HFA 108 (90 BASE) MCG/ACT IN AERS: 1.0000 | INHALATION_SPRAY | RESPIRATORY_TRACT | Status: DC | PRN

## 2015-06-16 MED ORDER — ALBUTEROL SULFATE (2.5 MG/3ML) 0.083% IN NEBU: 2.5000 mg | INHALATION_SOLUTION | RESPIRATORY_TRACT | Status: DC | PRN

## 2015-06-16 NOTE — Progress Notes (Signed)
[address] [place] [address] Dept: [phone]  FOLLOW UP NOTE  Patient ID: Marcia GoodellValerie Stevenson, female    DOB: 10-Jul-1955  Age: 60 y.o. MRN: [id number] Date of Office Visit: 06/16/2015  Assessment Chief Complaint: Cough  HPI Marcia GoodellValerie Stevenson presents for follow-up of asthma and allergic rhinitis. We saw her for the last time in March 2016. She was doing quite well until 3 weeks ago when she had an exacerbation of asthma and was given prednisone for 6 days and an antibiotic. She is allergic to grass pollen, weed pollen and tree pollens. Over the past week she has been having more coughing spells  Current medications Symbicort 160-4.5 -2 puffs every 12 hours, cetirizine 10 mg once a day and albuterol 0.083% one unit dose every 4 hours if needed, lisinopril 10 mg once a day. Her other medications are outlined in the chart   Drug Allergies:  No Known Allergies  Physical Exam: BP 138/80 mmHg  Pulse 102  Temp(Src) 98.3 F (36.8 C) (Oral)  Resp 20  Ht 5\' 6"  (1.676 m)  Wt 211 lb (95.709 kg)  BMI 34.07 kg/m2   Physical Exam  Constitutional: She is oriented to person, place, and time. She appears well-developed and well-nourished.  HENT:  Eyes normal. Ears normal. Nose moderate swelling of nasal turbinates. Pharynx normal.  Neck: Neck supple.  Cardiovascular:  S1 and S2 normal no murmurs  Pulmonary/Chest:  Clear to percussion and auscultation  Lymphadenopathy:    She has no cervical adenopathy.  Neurological: She is alert and oriented to person, place, and time.  Skin:  Clear  Psychiatric: She has a normal mood and affect. Her behavior is normal. Judgment and thought content normal.  Vitals reviewed.   Diagnostics:  FVC 2.85 L FEV1 2.12 L. Predicted FVC 2.90 L predicted FEV1 2.29 L-the spirometry is in the normal range  Assessment and Plan: 1. Moderate persistent asthma, with acute exacerbation   2. Allergic rhinitis due to pollen   3. Essential hypertension      Meds ordered this encounter  Medications  . albuterol (PROVENTIL HFA;VENTOLIN HFA) 108 (90 Base) MCG/ACT inhaler    Sig: Inhale 1-2 puffs into the lungs every 4 (four) hours as needed for wheezing or shortness of breath.    Dispense:  3 Inhaler    Refill:  1    90 DAY SUPPLY  . albuterol (PROVENTIL) (2.5 MG/3ML) 0.083% nebulizer solution    Sig: Take 3 mLs (2.5 mg total) by nebulization every 4 (four) hours as needed for wheezing or shortness of breath.    Dispense:  180 mL    Refill:  3    90 DAY SUPPLY  . montelukast (SINGULAIR) 10 MG tablet    Sig: Take 1 tablet (10 mg total) by mouth at bedtime.    Dispense:  30 tablet    Refill:  5  . fluticasone (FLONASE) 50 MCG/ACT nasal spray    Sig: Place 2 sprays into both nostrils daily.    Dispense:  16 g    Refill:  5  . budesonide-formoterol (SYMBICORT) 160-4.5 MCG/ACT inhaler    Sig: Inhale 2 puffs into the lungs 2 (two) times daily.    Dispense:  3 Inhaler    Refill:  1    90 DAY SUPPLY    Patient Instructions  Zyrtec 10 mg once a day for runny nose or itchy eyes Fluticasone 2 sprays per nostril once a day for stuffy nose  Ventolin 2 puffs every 4  hours if needed for wheezing or coughing spells or instead albuterol 0.083% one unit dose every 4 hours if needed Symbicort 160-4.5-2 puffs every 12 hours for coughing or wheezing   Montelukast 10 mg once a day for coughing or wheezing Add prednisone 10 mg twice a day for 4 days 10 mg on the fifth day Call me if you're not doing better on this treatment plan    Return in about 6 weeks (around 07/28/2015).    Thank you for the opportunity to care for this patient.  Please do not hesitate to contact me with questions.  Tonette Bihari, M.D.  [facility] [address] [place], [place] [address] [phone]

## 2015-06-16 NOTE — Patient Instructions (Signed)
Zyrtec 10 mg once a day for runny nose or itchy eyes Fluticasone 2 sprays per nostril once a day for stuffy nose  Ventolin 2 puffs every 4 hours if needed for wheezing or coughing spells or instead albuterol 0.083% one unit dose every 4 hours if needed Symbicort 160-4.5-2 puffs every 12 hours for coughing or wheezing   Montelukast 10 mg once a day for coughing or wheezing Add prednisone 10 mg twice a day for 4 days 10 mg on the fifth day Call me if you're not doing better on this treatment plan

## 2015-07-29 DIAGNOSIS — R739 Hyperglycemia, unspecified: Secondary | ICD-10-CM | POA: Insufficient documentation

## 2015-08-30 DIAGNOSIS — T881XXA Other complications following immunization, not elsewhere classified, initial encounter: Secondary | ICD-10-CM | POA: Insufficient documentation

## 2015-08-30 DIAGNOSIS — T7840XA Allergy, unspecified, initial encounter: Secondary | ICD-10-CM | POA: Insufficient documentation

## 2015-08-30 DIAGNOSIS — B029 Zoster without complications: Secondary | ICD-10-CM | POA: Insufficient documentation

## 2015-10-26 ENCOUNTER — Other Ambulatory Visit (HOSPITAL_BASED_OUTPATIENT_CLINIC_OR_DEPARTMENT_OTHER): Payer: Self-pay | Admitting: Internal Medicine

## 2015-10-26 DIAGNOSIS — Z1231 Encounter for screening mammogram for malignant neoplasm of breast: Secondary | ICD-10-CM

## 2015-11-28 ENCOUNTER — Ambulatory Visit (HOSPITAL_BASED_OUTPATIENT_CLINIC_OR_DEPARTMENT_OTHER)
Admission: RE | Admit: 2015-11-28 | Discharge: 2015-11-28 | Disposition: A | Discharge status: Home / Self Care | Payer: Federal, State, Local not specified - PPO | Source: Ambulatory Visit | Attending: Internal Medicine | Admitting: Internal Medicine

## 2015-11-28 ENCOUNTER — Other Ambulatory Visit: Payer: Self-pay | Admitting: Medical

## 2015-11-28 DIAGNOSIS — Z1231 Encounter for screening mammogram for malignant neoplasm of breast: Secondary | ICD-10-CM | POA: Diagnosis not present

## 2015-12-05 ENCOUNTER — Other Ambulatory Visit: Payer: Self-pay | Admitting: Pediatrics

## 2016-03-26 DIAGNOSIS — M4316 Spondylolisthesis, lumbar region: Secondary | ICD-10-CM | POA: Insufficient documentation

## 2016-10-29 ENCOUNTER — Other Ambulatory Visit (HOSPITAL_BASED_OUTPATIENT_CLINIC_OR_DEPARTMENT_OTHER): Payer: Self-pay | Admitting: Internal Medicine

## 2016-10-29 DIAGNOSIS — Z1231 Encounter for screening mammogram for malignant neoplasm of breast: Secondary | ICD-10-CM

## 2016-10-30 ENCOUNTER — Other Ambulatory Visit: Payer: Self-pay | Admitting: Pediatrics

## 2016-11-28 ENCOUNTER — Encounter (HOSPITAL_BASED_OUTPATIENT_CLINIC_OR_DEPARTMENT_OTHER): Payer: Self-pay

## 2016-11-28 ENCOUNTER — Ambulatory Visit (HOSPITAL_BASED_OUTPATIENT_CLINIC_OR_DEPARTMENT_OTHER)
Admission: RE | Admit: 2016-11-28 | Discharge: 2016-11-28 | Disposition: A | Discharge status: Home / Self Care | Payer: Federal, State, Local not specified - PPO | Source: Ambulatory Visit | Attending: Internal Medicine | Admitting: Internal Medicine

## 2016-11-28 DIAGNOSIS — Z1231 Encounter for screening mammogram for malignant neoplasm of breast: Secondary | ICD-10-CM | POA: Diagnosis not present

## 2016-12-10 ENCOUNTER — Ambulatory Visit: Payer: Federal, State, Local not specified - PPO | Admitting: Pediatrics

## 2016-12-10 ENCOUNTER — Encounter: Payer: Self-pay | Admitting: Pediatrics

## 2016-12-10 VITALS — BP 124/70 | HR 78 | Temp 98.5°F | Resp 16 | Ht 66.0 in | Wt 203.0 lb

## 2016-12-10 DIAGNOSIS — I10 Essential (primary) hypertension: Secondary | ICD-10-CM | POA: Diagnosis not present

## 2016-12-10 DIAGNOSIS — J301 Allergic rhinitis due to pollen: Secondary | ICD-10-CM | POA: Diagnosis not present

## 2016-12-10 DIAGNOSIS — J4530 Mild persistent asthma, uncomplicated: Secondary | ICD-10-CM | POA: Diagnosis not present

## 2016-12-10 MED ORDER — BUDESONIDE-FORMOTEROL FUMARATE 160-4.5 MCG/ACT IN AERO: INHALATION_SPRAY | RESPIRATORY_TRACT | 5 refills | Status: DC

## 2016-12-10 MED ORDER — ALBUTEROL SULFATE HFA 108 (90 BASE) MCG/ACT IN AERS: 2.0000 | INHALATION_SPRAY | RESPIRATORY_TRACT | 1 refills | Status: DC | PRN

## 2016-12-10 NOTE — Progress Notes (Signed)
[address] [place] [address] Dept: [phone]  FOLLOW UP NOTE  Patient ID: Marcia Stevenson, female    DOB: 1955-10-06  Age: 61 y.o. MRN: [id number] Date of Office Visit: 12/10/2016  Assessment  Chief Complaint: Asthma (will need refills.)  HPI Marcia Stevenson presents to the clinic today for follow-up of asthma and allergic rhinitis. She was last seen in this office on 06/16/2015 by Dr. Beaulah DinningBardelas.  At that time she was reported as doing well with her asthma and allergic rhinitis.  At today's visit, Marcia Stevenson reports has been out of Symbicort since the middle of September and has been using her Ventolin inhaler about every other day for her asthma symptoms.  Prior to September, she had been taking Symbicort 160- 1 puff in the morning and 1 puff in the evening an effort to conserve the medication.  She reports she feels short of breath in the morning, when the humidity is high, and when she is around irritants such as animals and strong odors.  She reports coughing in the morning which is nonproductive.  She does not experience nighttime awakenings due to asthma symptoms or limitations of activity due to asthma symptoms. She has not visited the emergency department or urgent care or needed an antibiotic for her allergy symptoms since her last visit.  She reports she took a prednisone taper over this summer for allergy symptoms.  She reports her rhinitis is well controlled.  Her symptoms include occasional nasal congestion and some postnasal drip.  She takes Zyrtec 10 mg every day and is not using any nasal steroids or saline nasal rinses.    Current medications include Symbicort 160/4.5, fluticasone nasal spray, Zyrtec 10 mg, Proventil 0.083% nebulizer solution, and Proventil HFA.  Other medications are outlined in the chart.     Drug Allergies:  Allergies  Allergen Reactions  . Zoster Vaccine Live Hives    Herpes zoster Herpes zoster     Physical Exam: BP 124/70 (BP Location:  Left Arm, Patient Position: Sitting, Cuff Size: Normal)   Pulse 78   Temp 98.5 F (36.9 C) (Oral)   Resp 16   Ht 5\' 6"  (1.676 m)   Wt 203 lb (92.1 kg)   SpO2 97%   BMI 32.77 kg/m    Physical Exam  Constitutional: She is oriented to person, place, and time. She appears well-developed and well-nourished.  HENT:  Head: Normocephalic.  Right Ear: External ear normal.  Nose: Nose normal.  Mouth/Throat: Oropharynx is clear and moist.  Nares normal.  Ears normal.  Pharynx normal  Eyes: Conjunctivae are normal.  Eyes normal  Neck: Normal range of motion. Neck supple.  Cardiovascular: Normal rate, regular rhythm and normal heart sounds.  S1-S2 normal.  Regular heart rate and rhythm  Pulmonary/Chest: Effort normal and breath sounds normal.  Lungs clear to auscultation  Musculoskeletal: Normal range of motion.  Neurological: She is alert and oriented to person, place, and time.  Skin: Skin is warm and dry.  Psychiatric: She has a normal mood and affect. Her behavior is normal. Judgment and thought content normal.    Diagnostics: FEV1 2.34, FVC 3.17.  Predicted FEV1 2.26, predicted FVC 2.88.  Therefore, spirometry is within the normal range.    Assessment and Plan: 1. Allergic rhinitis due to pollen, unspecified seasonality   2. Mild persistent asthma without complication   3. Essential hypertension     Meds ordered this encounter  Medications  . budesonide-formoterol (SYMBICORT) 160-4.5 MCG/ACT inhaler  Sig: Two puffs twice a day to prevent cough or wheeze. Rinse, gargle and spit after use.    Dispense:  10.2 g    Refill:  5  . albuterol (VENTOLIN HFA) 108 (90 Base) MCG/ACT inhaler    Sig: Inhale 2 puffs into the lungs every 4 (four) hours as needed for wheezing or shortness of breath.    Dispense:  8 g    Refill:  1    Patient Instructions  Zyrtec 10 mg once a day for runny nose or itchy eyes Fluticasone 2 sprays per nostril once a day for stuffy nose Ventolin 2 puffs  every 4 hours if needed for wheezing or coughing spells or instead albuterol 0.083% one unit dose every 4 hours if needed Symbicort 160-4.5-2 puffs every 12 hours to prevent cough or wheeze  Continue your other medications as listed in your chart  Follow up in 6 months  Call me if you're not doing better on this treatment plan    Return in about 6 months (around 06/10/2017), or if symptoms worsen or fail to improve.   Marcia Stevenson was seen in the clinic with Dr. Beaulah DinningBardelas today.  Thank you for the opportunity to care for this patient.  Please do not hesitate to contact me with questions.  Tonette BihariJ. A. Jazmene Racz, M.D.  [facility] [address] [place], [place] [address] [phone]

## 2016-12-10 NOTE — Patient Instructions (Addendum)
Zyrtec 10 mg once a day for runny nose or itchy eyes Fluticasone 2 sprays per nostril once a day for stuffy nose Ventolin 2 puffs every 4 hours if needed for wheezing or coughing spells or instead albuterol 0.083% one unit dose every 4 hours if needed Symbicort 160-4.5-2 puffs every 12 hours to prevent cough or wheeze  Continue your other medications as listed in your chart  Follow up in 6 months  Call me if you're not doing better on this treatment plan

## 2017-06-10 ENCOUNTER — Encounter: Payer: Self-pay | Admitting: Pediatrics

## 2017-06-10 ENCOUNTER — Ambulatory Visit: Payer: Federal, State, Local not specified - PPO | Admitting: Pediatrics

## 2017-06-10 VITALS — BP 136/88 | HR 94 | Temp 98.0°F | Resp 20

## 2017-06-10 DIAGNOSIS — I10 Essential (primary) hypertension: Secondary | ICD-10-CM

## 2017-06-10 DIAGNOSIS — J4540 Moderate persistent asthma, uncomplicated: Secondary | ICD-10-CM | POA: Diagnosis not present

## 2017-06-10 DIAGNOSIS — J301 Allergic rhinitis due to pollen: Secondary | ICD-10-CM

## 2017-06-10 MED ORDER — BUDESONIDE-FORMOTEROL FUMARATE 160-4.5 MCG/ACT IN AERO: INHALATION_SPRAY | RESPIRATORY_TRACT | 5 refills | Status: DC

## 2017-06-10 MED ORDER — MONTELUKAST SODIUM 10 MG PO TABS: 10.0000 mg | ORAL_TABLET | Freq: Every day | ORAL | 5 refills | Status: DC

## 2017-06-10 NOTE — Progress Notes (Signed)
  [address] [place] [address] Dept: [phone]  FOLLOW UP NOTE  Patient ID: Marcia Stevenson, female    DOB: Sep 25, 1955  Age: 62 y.o. MRN: [id number] Date of Office Visit: 06/10/2017  Assessment  Chief Complaint: Allergies and Asthma  HPI Marcia Stevenson presents for follow-up of asthma and allergic rhinitis. She had to use prednisone for 5 days beginning on Christmas Day when she was in a cruise. Her insurance company would not pay for Ventolin. She is on Symbicort 16O- 2 puffs every 12 hours and Zyrtec 10 mg once a day. She prefers Ventolin  Her asthma is not totally well controlled. She uses Pro-air once or twice a day if needed .  Current medications are outlined in the chart   Drug Allergies:  Allergies  Allergen Reactions  . Zoster Vaccine Live Hives    Herpes zoster Herpes zoster     Physical Exam: BP 136/88   Pulse 94   Temp 98 F (36.7 C) (Oral)   Resp 20   SpO2 97%    Physical Exam  Constitutional: She is oriented to person, place, and time. She appears well-developed and well-nourished.  HENT:  Eyes normal. Ears normal. Nose normal. Pharynx normal.  Neck: Neck supple.  Cardiovascular:  S1 and S2 normal no murmurs  Pulmonary/Chest:  Clear to percussion and auscultation  Lymphadenopathy:    She has no cervical adenopathy.  Neurological: She is alert and oriented to person, place, and time.  Psychiatric: She has a normal mood and affect. Her behavior is normal. Judgment and thought content normal.  Vitals reviewed.   Diagnostics:  FVC 2.98 L FEV1 2.26 L. Predicted FVC 2.85 L predicted FEV1 2.24 L-the spirometry is in the normal range  Assessment and Plan: 1. Moderate persistent asthma without complication   2. Seasonal allergic rhinitis due to pollen   3. Essential hypertension     Meds ordered this encounter  Medications  . montelukast (SINGULAIR) 10 MG tablet    Sig: Take 1 tablet (10 mg total) by mouth at bedtime.    Dispense:  30  tablet    Refill:  5    Patient will call  . budesonide-formoterol (SYMBICORT) 160-4.5 MCG/ACT inhaler    Sig: Two puffs twice a day to prevent cough or wheeze. Rinse, gargle and spit after use.    Dispense:  10.2 g    Refill:  5    Patient Instructions  Zyrtec 10 mg once a day for runny nose or itchy eyes Fluticasone 2 sprays per nostril once a day if needed for stuffy nose Symbicort 160-4.5 -2 puffs every 12 hours to prevent coughing or wheezing Pro-air 2 puffs every 4 hours if needed for wheezing or coughing spells or instead albuterol 0.083% one unit dose of 4 hours if needed Call me if you are  not doing well on this  treatment plan Continue your other medications Find out if Proventil is a preferred medication with your insurance company compared to Pro-air If you're not doing better, we will add montelukast 10 mg once a day.    Return in about 6 months (around 12/10/2017).    Thank you for the opportunity to care for this patient.  Please do not hesitate to contact me with questions.  Tonette BihariJ. A. Devonne Lalani, M.D.  [facility] [address] [place], [place] [address] [phone]

## 2017-06-10 NOTE — Patient Instructions (Addendum)
Zyrtec 10 mg once a day for runny nose or itchy eyes Fluticasone 2 sprays per nostril once a day if needed for stuffy nose Symbicort 160-4.5 -2 puffs every 12 hours to prevent coughing or wheezing Pro-air 2 puffs every 4 hours if needed for wheezing or coughing spells or instead albuterol 0.083% one unit dose of 4 hours if needed Call me if you are  not doing well on this  treatment plan Continue your other medications Find out if Proventil is a preferred medication with your insurance company compared to Pro-air If you're not doing better, we will add montelukast 10 mg once a day.

## 2017-06-19 ENCOUNTER — Other Ambulatory Visit: Payer: Self-pay | Admitting: Allergy

## 2017-06-19 ENCOUNTER — Telehealth: Payer: Self-pay | Admitting: Allergy

## 2017-06-19 MED ORDER — ALBUTEROL SULFATE HFA 108 (90 BASE) MCG/ACT IN AERS: 2.0000 | INHALATION_SPRAY | RESPIRATORY_TRACT | 2 refills | Status: DC | PRN

## 2017-06-19 NOTE — Telephone Encounter (Signed)
Pharmacy called and said they would dispense albuterol generic.

## 2017-08-07 ENCOUNTER — Other Ambulatory Visit: Payer: Self-pay | Admitting: Allergy

## 2017-08-07 MED ORDER — ALBUTEROL SULFATE HFA 108 (90 BASE) MCG/ACT IN AERS: 2.0000 | INHALATION_SPRAY | RESPIRATORY_TRACT | 1 refills | Status: DC | PRN

## 2017-11-20 ENCOUNTER — Other Ambulatory Visit (HOSPITAL_BASED_OUTPATIENT_CLINIC_OR_DEPARTMENT_OTHER): Payer: Self-pay | Admitting: Family Medicine

## 2017-11-20 DIAGNOSIS — Z1231 Encounter for screening mammogram for malignant neoplasm of breast: Secondary | ICD-10-CM

## 2017-11-29 ENCOUNTER — Ambulatory Visit (HOSPITAL_BASED_OUTPATIENT_CLINIC_OR_DEPARTMENT_OTHER)
Admission: RE | Admit: 2017-11-29 | Discharge: 2017-11-29 | Disposition: A | Discharge status: Home / Self Care | Payer: Federal, State, Local not specified - PPO | Source: Ambulatory Visit | Attending: Family Medicine | Admitting: Family Medicine

## 2017-11-29 DIAGNOSIS — Z1231 Encounter for screening mammogram for malignant neoplasm of breast: Secondary | ICD-10-CM | POA: Diagnosis not present

## 2017-12-03 ENCOUNTER — Other Ambulatory Visit: Payer: Self-pay | Admitting: Family Medicine

## 2017-12-03 DIAGNOSIS — R928 Other abnormal and inconclusive findings on diagnostic imaging of breast: Secondary | ICD-10-CM

## 2017-12-10 ENCOUNTER — Encounter: Payer: Self-pay | Admitting: Pediatrics

## 2017-12-10 ENCOUNTER — Ambulatory Visit: Payer: Federal, State, Local not specified - PPO | Admitting: Pediatrics

## 2017-12-10 ENCOUNTER — Other Ambulatory Visit: Payer: Federal, State, Local not specified - PPO

## 2017-12-10 VITALS — BP 132/72 | HR 94 | Temp 98.5°F | Resp 20

## 2017-12-10 DIAGNOSIS — J301 Allergic rhinitis due to pollen: Secondary | ICD-10-CM | POA: Diagnosis not present

## 2017-12-10 DIAGNOSIS — H1010 Acute atopic conjunctivitis, unspecified eye: Secondary | ICD-10-CM

## 2017-12-10 DIAGNOSIS — I10 Essential (primary) hypertension: Secondary | ICD-10-CM

## 2017-12-10 DIAGNOSIS — J4540 Moderate persistent asthma, uncomplicated: Secondary | ICD-10-CM | POA: Diagnosis not present

## 2017-12-10 MED ORDER — FLUTICASONE PROPIONATE 50 MCG/ACT NA SUSP: 2.0000 | Freq: Every day | NASAL | 5 refills | Status: DC

## 2017-12-10 MED ORDER — OLOPATADINE HCL 0.2 % OP SOLN: 1.0000 [drp] | OPHTHALMIC | 5 refills | Status: DC

## 2017-12-10 MED ORDER — ALBUTEROL SULFATE HFA 108 (90 BASE) MCG/ACT IN AERS: 2.0000 | INHALATION_SPRAY | RESPIRATORY_TRACT | 5 refills | Status: DC | PRN

## 2017-12-10 MED ORDER — BUDESONIDE-FORMOTEROL FUMARATE 160-4.5 MCG/ACT IN AERO: INHALATION_SPRAY | RESPIRATORY_TRACT | 5 refills | Status: DC

## 2017-12-10 MED ORDER — CETIRIZINE HCL 10 MG PO TABS: ORAL_TABLET | ORAL | 5 refills | Status: DC

## 2017-12-10 MED ORDER — MONTELUKAST SODIUM 10 MG PO TABS: 10.0000 mg | ORAL_TABLET | Freq: Every day | ORAL | 5 refills | Status: DC

## 2017-12-10 NOTE — Progress Notes (Signed)
[address] [place] [address] Dept: [phone]  FOLLOW UP NOTE  Patient ID: Marcia Stevenson, female    DOB: [birth year]  Age: 62 y.o. MRN: [id number] Date of Office Visit: 12/10/2017  Assessment  Chief Complaint: Asthma  HPI Marcia Stevenson is a 62 year old female who presents to the clinic for a follow up visit. She reports her asthma has been moderately well controlled with occasional shortness of breath, cough, and wheeze with strenuous activity. She denies shortness of breath cough, and wheeze at rest and at nighttime. She is using Symbicort 160-2 puffs twice a day without a spacer, montelukast 10 mg about once a month, and albuterol when the weather is cold or 1-2 times a week. Allergic rhinitis is well controlled with as needed medications. She reports red and itchy eyes that are partially relieved by lubricating eye drops. Her current medications are listed in the chart.   Drug Allergies:  Allergies  Allergen Reactions  . Zoster Vaccine Live Hives    Herpes zoster Herpes zoster     Physical Exam: BP 132/72   Pulse 94   Temp 98.5 F (36.9 C) (Oral)   Resp 20   SpO2 97%    Physical Exam  HENT:  Head: Normocephalic.  Right Ear: External ear normal.  Left Ear: External ear normal.  Mouth/Throat: Oropharynx is clear and moist.  Bilateral nares edematous and pale with no nasal drainage noted. Pharynx normal. Ears normal. Eyes normal.   Eyes: Conjunctivae are normal.  Neck: Normal range of motion. Neck supple.  Cardiovascular: Normal rate, regular rhythm and normal heart sounds.  No murmur noted  Pulmonary/Chest: Effort normal and breath sounds normal.  Lungs clear to auscultation  Musculoskeletal: Normal range of motion.  Neurological: She is alert.  Skin: Skin is warm and dry.  Psychiatric: She has a normal mood and affect. Her behavior is normal. Judgment and thought content normal.  Vitals reviewed.   Diagnostics: FVC 2.95, FEV1 2.09. Predicted FVC  2.85, predicted FEV1 2.24. Spirometry is within the normal range.    Assessment and Plan: 1. Moderate persistent asthma without complication   2. Seasonal allergic rhinitis due to pollen   3. Seasonal allergic conjunctivitis   4. Essential hypertension     Meds ordered this encounter  Medications  . albuterol (VENTOLIN HFA) 108 (90 Base) MCG/ACT inhaler    Sig: Inhale 2 puffs into the lungs every 4 (four) hours as needed for wheezing or shortness of breath.    Dispense:  1 Inhaler    Refill:  5  . fluticasone (FLONASE) 50 MCG/ACT nasal spray    Sig: Place 2 sprays into both nostrils daily.    Dispense:  16 g    Refill:  5  . montelukast (SINGULAIR) 10 MG tablet    Sig: Take 1 tablet (10 mg total) by mouth at bedtime.    Dispense:  30 tablet    Refill:  5  . cetirizine (ZYRTEC) 10 MG tablet    Sig: Take 1 tablet once a day as needed for runny nose or itch    Dispense:  31 tablet    Refill:  5  . budesonide-formoterol (SYMBICORT) 160-4.5 MCG/ACT inhaler    Sig: Two puffs twice a day to prevent cough or wheeze. Rinse, gargle and spit after use.    Dispense:  10.2 g    Refill:  5  . Olopatadine HCl (PATADAY) 0.2 % SOLN    Sig: Place 1 drop into both eyes  1 day or 1 dose.    Dispense:  1 Bottle    Refill:  5    Patient Instructions  Continue Zyrtec 10 mg once a day for runny nose or itchy eyes Fluticasone 2 sprays per nostril once a day if needed for stuffy nose Symbicort 160-4.5 -2 puffs with a spacer every 12 hours to prevent coughing or wheezing Proventil 2 puffs every 4 hours if needed for wheezing or coughing spells or instead albuterol 0.083% one unit dose of 4 hours if needed.  You may use Proventil 5-15 minutes before exercise to prevent cough or wheeze Begin montelukast 10 mg once a day to prevent cough or wheeze Pataday eye drop one drop in each eye once a day as needed for red or itchy eyes  Call me if you are  not doing well on this  treatment plan  Continue  your other medications  Follow up in 6 months or sooner if needed   Return in about 6 months (around 06/11/2018), or if symptoms worsen or fail to improve.   Thank you for the opportunity to care for this patient.  Please do not hesitate to contact me with questions.  Thermon LeylandAnne Ambs, FNP [facility]  I have provided oversight concerning Thermon Leylandnne Ambs' evaluation and treatment of this patient's health issues addressed during today's encounter. I agree with the assessment and therapeutic plan as outlined in the note.   Thank you for the opportunity to care for this patient.  Please do not hesitate to contact me with questions.  Tonette BihariJ. A. Sybella Harnish, M.D.  [facility] [place] [address] [place], [place] [address] [phone]

## 2017-12-10 NOTE — Patient Instructions (Addendum)
Continue Zyrtec 10 mg once a day for runny nose or itchy eyes Fluticasone 2 sprays per nostril once a day if needed for stuffy nose Symbicort 160-4.5 -2 puffs with a spacer every 12 hours to prevent coughing or wheezing Proventil 2 puffs every 4 hours if needed for wheezing or coughing spells or instead albuterol 0.083% one unit dose of 4 hours if needed.  You may use Proventil 5-15 minutes before exercise to prevent cough or wheeze Begin montelukast 10 mg once a day to prevent cough or wheeze Pataday eye drop one drop in each eye once a day as needed for red or itchy eyes  Call me if you are  not doing well on this  treatment plan  Continue your other medications  Follow up in 6 months or sooner if needed

## 2018-06-17 ENCOUNTER — Other Ambulatory Visit: Payer: Self-pay

## 2018-06-17 ENCOUNTER — Ambulatory Visit: Payer: Federal, State, Local not specified - PPO | Admitting: Pediatrics

## 2018-06-17 ENCOUNTER — Encounter: Payer: Self-pay | Admitting: Pediatrics

## 2018-06-17 VITALS — BP 110/64 | HR 100 | Temp 98.1°F | Resp 18 | Ht 66.0 in | Wt 210.8 lb

## 2018-06-17 DIAGNOSIS — J301 Allergic rhinitis due to pollen: Secondary | ICD-10-CM | POA: Diagnosis not present

## 2018-06-17 DIAGNOSIS — H1010 Acute atopic conjunctivitis, unspecified eye: Secondary | ICD-10-CM

## 2018-06-17 DIAGNOSIS — E669 Obesity, unspecified: Secondary | ICD-10-CM | POA: Diagnosis not present

## 2018-06-17 DIAGNOSIS — J4540 Moderate persistent asthma, uncomplicated: Secondary | ICD-10-CM

## 2018-06-17 MED ORDER — BUDESONIDE-FORMOTEROL FUMARATE 160-4.5 MCG/ACT IN AERO: INHALATION_SPRAY | RESPIRATORY_TRACT | 5 refills | Status: DC

## 2018-06-17 MED ORDER — ALBUTEROL SULFATE HFA 108 (90 BASE) MCG/ACT IN AERS: 2.0000 | INHALATION_SPRAY | RESPIRATORY_TRACT | 2 refills | Status: DC | PRN

## 2018-06-17 MED ORDER — ALBUTEROL SULFATE (2.5 MG/3ML) 0.083% IN NEBU: INHALATION_SOLUTION | RESPIRATORY_TRACT | 1 refills | Status: DC

## 2018-06-17 MED ORDER — FLUTICASONE PROPIONATE 50 MCG/ACT NA SUSP: NASAL | 5 refills | Status: AC

## 2018-06-17 MED ORDER — MONTELUKAST SODIUM 10 MG PO TABS: 10.0000 mg | ORAL_TABLET | Freq: Every day | ORAL | 5 refills | Status: DC

## 2018-06-17 MED ORDER — CETIRIZINE HCL 10 MG PO TABS: ORAL_TABLET | ORAL | 5 refills | Status: DC

## 2018-06-17 NOTE — Progress Notes (Signed)
[address] [place] [address] Dept: [phone]  FOLLOW UP NOTE  Patient ID: Marcia Stevenson, female    DOB: 1955-10-10  Age: 63 y.o. MRN: [id number] Date of Office Visit: 06/17/2018  Assessment  Chief Complaint: Asthma  HPI [name] presents for follow-up of asthma and allergic rhinitis.  She has been having at times , shortness of breath particularly when she is outside.  She is on Symbicort 160-2 puffs twice a day.  She is not using montelukast.  She has had to use albuterol about twice a week.  She has been having allergic nasal symptoms and itchy eyes during the springtime.  She is on cetirizine 10 mg once a day.  Other medications are outlined in the chart   Drug Allergies:  Allergies  Allergen Reactions  . Zoster Vaccine Live Hives    Herpes zoster Herpes zoster     Physical Exam: BP 110/64   Pulse 100   Temp 98.1 F (36.7 C) (Oral)   Resp 18   Ht 5\' 6"  (1.676 m)   Wt 210 lb 12.8 oz (95.6 kg)   SpO2 97%   BMI 34.02 kg/m    Physical Exam Vitals signs reviewed.  Constitutional:      Appearance: Normal appearance. She is obese.  HENT:     Head:     Comments: Eyes normal.  Ears normal.  Nose moderate swelling of nasal turbinates.  Pharynx normal. Neck:     Musculoskeletal: Neck supple.  Cardiovascular:     Rate and Rhythm: Normal rate and regular rhythm.     Comments: S1 S2 normal no murmurs Pulmonary:     Comments: Clear to percussion and auscultation Lymphadenopathy:     Cervical: No cervical adenopathy.  Neurological:     General: No focal deficit present.     Mental Status: She is alert and oriented to person, place, and time.  Psychiatric:        Mood and Affect: Mood normal.        Behavior: Behavior normal.        Thought Content: Thought content normal.        Judgment: Judgment normal.     Diagnostics: FVC 2.66 L FEV1 1.97 L.  Predicted FVC 2.82  L predicted FEV1 2.21 L-the spirometry is in the normal range  Assessment and  Plan: 1. Moderate persistent asthma without complication   2. Seasonal allergic rhinitis due to pollen   3. Seasonal allergic conjunctivitis   4. Obesity (BMI 30.0-34.9)     Meds ordered this encounter  Medications  . albuterol (VENTOLIN HFA) 108 (90 Base) MCG/ACT inhaler    Sig: Inhale 2 puffs into the lungs every 4 (four) hours as needed for wheezing or shortness of breath.    Dispense:  1 Inhaler    Refill:  2    Dispense albertol sulfate HFA only.  Marland Kitchen albuterol (PROVENTIL) (2.5 MG/3ML) 0.083% nebulizer solution    Sig: Take 3 mLs (2.5 mg total) by nebulization every 4 (four) hours as needed for wheezing or shortness of breath.    Dispense:  75 mL    Refill:  1  . budesonide-formoterol (SYMBICORT) 160-4.5 MCG/ACT inhaler    Sig: Two puffs every 12 hours to prevent coughing or wheezing.    Dispense:  10.2 g    Refill:  5  . fluticasone (FLONASE) 50 MCG/ACT nasal spray    Sig: 2 sprays per nostril once a day if needed for stuffy nose  Dispense:  16 g    Refill:  5  . cetirizine (ZYRTEC) 10 MG tablet    Sig: Take 1 tablet once a day as needed for runny nose or itchy eyes    Dispense:  31 tablet    Refill:  5  . montelukast (SINGULAIR) 10 MG tablet    Sig: Take 1 tablet (10 mg total) by mouth at bedtime.    Dispense:  30 tablet    Refill:  5    Patient Instructions  Cetirizine 10 mg-take 1 tablet once a day for runny nose or itchy eyes Fluticasone 2 sprays per nostril once a day if needed for stuffy nose Symbicort 160-4 .5-2 puffs every 12 hours to prevent coughing or wheezing Montelukast 10 mg-take 1 tablet once a day to prevent coughing or wheezing Ventolin 2 puffs every 4 hours if needed for wheezing or coughing spells or instead albuterol 0.083% 1 unit dose every 4 hours if needed Pataday 1 drop once a day if needed for itchy eyes Add prednisone 10 mg twice a day for 4 days 10 mg in the fifth day to bring her allergic symptoms under trouble Continue on your other  medications Call us if you are not doing well on this treatment plan   Return in about 6 months (around 12/17/2018).    Thank you for the opportunity to care for this patient.  Please do not hesitate to contact me with questions.  Tonette BihariJ. A. Jisel Fleet, M.D.  [facility] [address] [place], [place] [address] [phone]

## 2018-06-17 NOTE — Patient Instructions (Signed)
Cetirizine 10 mg-take 1 tablet once a day for runny nose or itchy eyes Fluticasone 2 sprays per nostril once a day if needed for stuffy nose Symbicort 160-4 .5-2 puffs every 12 hours to prevent coughing or wheezing Montelukast 10 mg-take 1 tablet once a day to prevent coughing or wheezing Ventolin 2 puffs every 4 hours if needed for wheezing or coughing spells or instead albuterol 0.083% 1 unit dose every 4 hours if needed Pataday 1 drop once a day if needed for itchy eyes Add prednisone 10 mg twice a day for 4 days 10 mg in the fifth day to bring her allergic symptoms under trouble Continue on your other medications Call us if you are not doing well on this treatment plan

## 2018-12-23 ENCOUNTER — Ambulatory Visit: Payer: Federal, State, Local not specified - PPO | Admitting: Pediatrics

## 2018-12-30 ENCOUNTER — Ambulatory Visit: Payer: Federal, State, Local not specified - PPO | Admitting: Pediatrics

## 2018-12-30 ENCOUNTER — Other Ambulatory Visit: Payer: Self-pay

## 2018-12-30 ENCOUNTER — Encounter: Payer: Self-pay | Admitting: Pediatrics

## 2018-12-30 VITALS — BP 134/72 | HR 92 | Temp 98.4°F | Resp 20 | Ht 66.0 in | Wt 206.1 lb

## 2018-12-30 DIAGNOSIS — I10 Essential (primary) hypertension: Secondary | ICD-10-CM | POA: Diagnosis not present

## 2018-12-30 DIAGNOSIS — J301 Allergic rhinitis due to pollen: Secondary | ICD-10-CM

## 2018-12-30 DIAGNOSIS — J4540 Moderate persistent asthma, uncomplicated: Secondary | ICD-10-CM | POA: Diagnosis not present

## 2018-12-30 MED ORDER — ALBUTEROL SULFATE (2.5 MG/3ML) 0.083% IN NEBU: INHALATION_SOLUTION | RESPIRATORY_TRACT | 1 refills | Status: DC

## 2018-12-30 MED ORDER — ALBUTEROL SULFATE HFA 108 (90 BASE) MCG/ACT IN AERS: 2.0000 | INHALATION_SPRAY | RESPIRATORY_TRACT | 1 refills | Status: DC | PRN

## 2018-12-30 MED ORDER — BUDESONIDE-FORMOTEROL FUMARATE 160-4.5 MCG/ACT IN AERO: INHALATION_SPRAY | RESPIRATORY_TRACT | 5 refills | Status: DC

## 2018-12-30 MED ORDER — MONTELUKAST SODIUM 10 MG PO TABS: ORAL_TABLET | ORAL | 3 refills | Status: DC

## 2018-12-30 NOTE — Patient Instructions (Signed)
Cetirizine 10 mg-take 1 tablet once a day for runny nose or itchy eyes Fluticasone 2 sprays per nostril once a day if needed for stuffy nose  Montelukast 10 mg-take 1 tablet once a day to prevent coughing or wheezing Symbicort 160-2 puffs every 12 hours to prevent coughing or wheezing Ventolin 2 puffs every 4 hours if needed for wheezing or coughing spells, or instead albuterol 0.083% 1 unit dose every 4 hours if needed.  You may use Ventolin 2 puffs 5 to 15 minutes before exercise  Continue on your other medications Call us if you are not doing well on this treatment plan

## 2018-12-30 NOTE — Progress Notes (Signed)
[address] [place] [address] Dept: [phone]  FOLLOW UP NOTE  Patient ID: Marcia Stevenson, female    DOB: 1955-11-05  Age: 63 y.o. MRN: [id number] Date of Office Visit: 12/30/2018  Assessment  Chief Complaint: Asthma (intermittent flares with the weather change.)  HPI Marcia Stevenson presents for follow-up of asthma and allergic rhinitis.  Her asthma has been well controlled by Symbicort 160-2 puffs every 12 hours and montelukast 10 mg once a day.  She has not had to use albuterol nebulizations since last summer.  She has had a flu vaccination Her nasal symptoms are controlled by cetirizine 10 mg once a day if needed and fluticasone 2 sprays per nostril once a day if needed Other current medications are outlined in the chart   Drug Allergies:  Allergies  Allergen Reactions  . Zoster Vaccine Live Hives    Herpes zoster Herpes zoster     Physical Exam: BP 134/72 (BP Location: Left Arm, Patient Position: Sitting, Cuff Size: Large)   Pulse 92   Temp 98.4 F (36.9 C) (Oral)   Resp 20   Ht 5\' 6"  (1.676 m)   Wt 206 lb 2.1 oz (93.5 kg)   SpO2 99%   BMI 33.27 kg/m    Physical Exam Vitals reviewed.  Constitutional:      Appearance: Normal appearance. She is normal weight.  HENT:     Head:     Comments: Eyes normal.  Ears normal.  Nose normal.  Pharynx normal. Cardiovascular:     Comments: S1-S2 normal no murmurs Pulmonary:     Comments: Clear to percussion and auscultation Musculoskeletal:     Cervical back: Neck supple.  Lymphadenopathy:     Cervical: No cervical adenopathy.  Neurological:     General: No focal deficit present.     Mental Status: She is alert and oriented to person, place, and time. Mental status is at baseline.  Psychiatric:        Mood and Affect: Mood normal.        Behavior: Behavior normal.        Thought Content: Thought content normal.        Judgment: Judgment normal.     Diagnostics: FVC 2.64 L FEV1 2.14 L.  Predicted FVC  2.79 L predicted FEV1 2.19 L-the spirometry is in the normal range  Assessment and Plan: 1. Moderate persistent asthma without complication   2. Essential hypertension   3. Seasonal allergic rhinitis due to pollen     Meds ordered this encounter  Medications  . albuterol (PROVENTIL) (2.5 MG/3ML) 0.083% nebulizer solution    Sig: Take 3 mLs (2.5 mg total) by nebulization every 4 (four) hours as needed for wheezing or shortness of breath.    Dispense:  300 mL    Refill:  1    Please dispense 4 boxes  . albuterol (VENTOLIN HFA) 108 (90 Base) MCG/ACT inhaler    Sig: Inhale 2 puffs into the lungs every 4 (four) hours as needed for wheezing or shortness of breath.    Dispense:  18 g    Refill:  1    Dispense albertol sulfate HFA only.  . budesonide-formoterol (SYMBICORT) 160-4.5 MCG/ACT inhaler    Sig: 2 puffs every 12 hours to prevent coughing or wheezing.    Dispense:  1 Inhaler    Refill:  5  . montelukast (SINGULAIR) 10 MG tablet    Sig: Take 1 tablet once a day for coughing or wheezing.  Dispense:  90 tablet    Refill:  3    Patient Instructions  Cetirizine 10 mg-take 1 tablet once a day for runny nose or itchy eyes Fluticasone 2 sprays per nostril once a day if needed for stuffy nose  Montelukast 10 mg-take 1 tablet once a day to prevent coughing or wheezing Symbicort 160-2 puffs every 12 hours to prevent coughing or wheezing Ventolin 2 puffs every 4 hours if needed for wheezing or coughing spells, or instead albuterol 0.083% 1 unit dose every 4 hours if needed.  You may use Ventolin 2 puffs 5 to 15 minutes before exercise  Continue on your other medications Call us if you are not doing well on this treatment plan    Return in about 6 months (around 06/30/2019).    Thank you for the opportunity to care for this patient.  Please do not hesitate to contact me with questions.  Tonette Bihari, M.D.  [facility] [address] [place], [place] [address] [phone]

## 2019-06-09 ENCOUNTER — Ambulatory Visit: Payer: Federal, State, Local not specified - PPO | Admitting: Pediatrics

## 2019-06-09 NOTE — Progress Notes (Addendum)
[address] [place] [address] Dept: [phone]  FOLLOW UP NOTE  Patient ID: Marcia Stevenson, female    DOB: [birth year]  Age: 64 y.o. MRN: [id number] Date of Office Visit: 06/10/2019  Assessment  Chief Complaint: Asthma  HPI Marcia Stevenson is a 64 year old female who presents to the clinic for follow-up visit.  She was last seen in this clinic on 12/30/2018 by Dr. Quinn Plowman for evaluation of asthma, allergic rhinitis, and allergic conjunctivitis.  At today's visit she reports her asthma has been moderately well controlled with no shortness of breath, a little wheeze occurring in the evening while first lying down, and a daily cough which is occasionally producing clear phlegm.  She is not currently using montelukast.  She is using Symbicort 160-2 puffs twice a day with a spacer and albuterol 1-2 times a day 1 lying down at nighttime.  She denies symptoms of reflux including heartburn and is not currently taking any medication for reflux.  Allergic rhinitis is reported as well controlled with cetirizine as needed and Flonase as needed.  Allergic conjunctivitis is reported as moderately well controlled with symptoms including red itchy eyes occurring occasionally for which he uses Pataday with relief of symptoms.  Her current medications are listed in the chart.   Drug Allergies:  Allergies  Allergen Reactions  . Zoster Vaccine Live Hives    Herpes zoster Herpes zoster     Physical Exam: BP 134/68 (BP Location: Left Arm, Patient Position: Sitting, Cuff Size: Normal)   Pulse 92   Temp 98.8 F (37.1 C) (Oral)   Resp 18   Ht 5\' 6"  (1.676 m)   Wt 203 lb 14.8 oz (92.5 kg)   SpO2 98%   BMI 32.91 kg/m    Physical Exam Vitals reviewed.  Constitutional:      Appearance: Normal appearance.  HENT:     Head: Normocephalic and atraumatic.     Right Ear: Tympanic membrane normal.     Left Ear: Tympanic membrane normal.     Nose:     Comments: Bilateral nares edematous and pale  with clear nasal drainage noted.  Pharynx normal.  Ears normal.  Eyes normal.    Mouth/Throat:     Pharynx: Oropharynx is clear.  Eyes:     Conjunctiva/sclera: Conjunctivae normal.  Cardiovascular:     Rate and Rhythm: Normal rate and regular rhythm.     Heart sounds: Normal heart sounds. No murmur.  Pulmonary:     Effort: Pulmonary effort is normal.     Breath sounds: Normal breath sounds.     Comments: Lungs clear to auscultation Musculoskeletal:        General: Normal range of motion.     Cervical back: Normal range of motion and neck supple.  Skin:    General: Skin is warm and dry.  Neurological:     Mental Status: She is alert and oriented to person, place, and time.  Psychiatric:        Mood and Affect: Mood normal.        Behavior: Behavior normal.        Thought Content: Thought content normal.        Judgment: Judgment normal.    Diagnostics: FVC 2.72, FEV1 2.04.  Predicted FVC 2.79, predicted FEV1 2.19.  Spirometry indicates normal ventilatory function.  Assessment and Plan: 1. Moderate persistent asthma without complication   2. Seasonal allergic rhinitis due to pollen   3. Seasonal allergic conjunctivitis  Meds ordered this encounter  Medications  . albuterol (PROVENTIL) (2.5 MG/3ML) 0.083% nebulizer solution    Sig: Take 3 mLs (2.5 mg total) by nebulization every 4 (four) hours as needed for wheezing or shortness of breath.    Dispense:  300 mL    Refill:  1    Please dispense 4 boxes  . albuterol (VENTOLIN HFA) 108 (90 Base) MCG/ACT inhaler    Sig: Inhale 2 puffs into the lungs every 4 (four) hours as needed for wheezing or shortness of breath.    Dispense:  54 g    Refill:  0    Dispense albertol sulfate HFA only. 90 day supply  . budesonide-formoterol (SYMBICORT) 160-4.5 MCG/ACT inhaler    Sig: Two puffs every 12 hours to prevent coughing or wheezing.    Dispense:  30.6 g    Refill:  1    Please dispense 90 day supply  . cetirizine (ZYRTEC) 10 MG  tablet    Sig: Take 1 tablet once a day as needed for runny nose or itchy eyes    Dispense:  90 tablet    Refill:  1    Patient Instructions  Asthma Restart montelukast 10 mg once a day to prevent cough or wheeze Continue Symbicort 160-2 puffs twice a day with a spacer to prevent cough or wheeze Continue albuterol 2 puffs every 4 hours as needed for cough or wheeze or instead, albuterol 0.083% 1 unit vial via nebulizer once every 4 hours as needed for cough or wheeze  Allergic rhinitis Continue cetirizine 10 mg once a day as needed for runny nose Continue Flonase 2 sprays in each nostril once a day as needed for stuffy nose.  In the right nostril, point the applicator out toward the right ear. In the left nostril, point the applicator out toward the left ear Consider saline nasal rinses as needed for nasal symptoms. Use this before any medicated nasal sprays for best result  Allergic conjunctivitis Some over the counter eye drops include Pataday one drop in each eye once a day as needed for red, itchy eyes OR Zaditor one drop in each eye twice a day as needed for red itchy eyes.  Call the clinic if this treatment plan is not working well for you  Follow up in 6 months or sooner if needed.    Return in about 6 months (around 12/10/2019), or if symptoms worsen or fail to improve.    Thank you for the opportunity to care for this patient.  Please do not hesitate to contact me with questions.  Gareth Morgan, FNP [facility]  ________________________________________________  I have provided oversight concerning [facility]'s evaluation and treatment of this patient's health issues addressed during today's encounter.  I agree with the assessment and therapeutic plan as outlined in the note.   Signed,   R Edgar Frisk, MD

## 2019-06-09 NOTE — Patient Instructions (Addendum)
Asthma Restart montelukast 10 mg once a day to prevent cough or wheeze Continue Symbicort 160-2 puffs twice a day with a spacer to prevent cough or wheeze Continue albuterol 2 puffs every 4 hours as needed for cough or wheeze or instead, albuterol 0.083% 1 unit vial via nebulizer once every 4 hours as needed for cough or wheeze  Allergic rhinitis Continue cetirizine 10 mg once a day as needed for runny nose Continue Flonase 2 sprays in each nostril once a day as needed for stuffy nose.  In the right nostril, point the applicator out toward the right ear. In the left nostril, point the applicator out toward the left ear Consider saline nasal rinses as needed for nasal symptoms. Use this before any medicated nasal sprays for best result  Allergic conjunctivitis Some over the counter eye drops include Pataday one drop in each eye once a day as needed for red, itchy eyes OR Zaditor one drop in each eye twice a day as needed for red itchy eyes.  Call the clinic if this treatment plan is not working well for you  Follow up in 6 months or sooner if needed.

## 2019-06-10 ENCOUNTER — Ambulatory Visit: Payer: Federal, State, Local not specified - PPO | Admitting: Family Medicine

## 2019-06-10 ENCOUNTER — Encounter: Payer: Self-pay | Admitting: Family Medicine

## 2019-06-10 ENCOUNTER — Other Ambulatory Visit: Payer: Self-pay

## 2019-06-10 VITALS — BP 134/68 | HR 92 | Temp 98.8°F | Resp 18 | Ht 66.0 in | Wt 203.9 lb

## 2019-06-10 DIAGNOSIS — J301 Allergic rhinitis due to pollen: Secondary | ICD-10-CM | POA: Diagnosis not present

## 2019-06-10 DIAGNOSIS — H1010 Acute atopic conjunctivitis, unspecified eye: Secondary | ICD-10-CM

## 2019-06-10 DIAGNOSIS — J4540 Moderate persistent asthma, uncomplicated: Secondary | ICD-10-CM | POA: Diagnosis not present

## 2019-06-10 MED ORDER — ALBUTEROL SULFATE HFA 108 (90 BASE) MCG/ACT IN AERS: 2.0000 | INHALATION_SPRAY | RESPIRATORY_TRACT | 0 refills | Status: DC | PRN

## 2019-06-10 MED ORDER — CETIRIZINE HCL 10 MG PO TABS: ORAL_TABLET | ORAL | 1 refills | Status: DC

## 2019-06-10 MED ORDER — ALBUTEROL SULFATE (2.5 MG/3ML) 0.083% IN NEBU: INHALATION_SOLUTION | RESPIRATORY_TRACT | 1 refills | Status: DC

## 2019-06-10 MED ORDER — BUDESONIDE-FORMOTEROL FUMARATE 160-4.5 MCG/ACT IN AERO: INHALATION_SPRAY | RESPIRATORY_TRACT | 1 refills | Status: DC

## 2019-06-16 ENCOUNTER — Other Ambulatory Visit: Payer: Self-pay | Admitting: Family Medicine

## 2019-06-17 ENCOUNTER — Encounter: Payer: Self-pay | Admitting: Family Medicine

## 2019-06-17 MED ORDER — ALBUTEROL SULFATE HFA 108 (90 BASE) MCG/ACT IN AERS: 2.0000 | INHALATION_SPRAY | RESPIRATORY_TRACT | 1 refills | Status: DC | PRN

## 2019-06-18 ENCOUNTER — Other Ambulatory Visit: Payer: Self-pay

## 2019-06-18 ENCOUNTER — Telehealth: Payer: Self-pay

## 2019-06-18 MED ORDER — ALBUTEROL SULFATE HFA 108 (90 BASE) MCG/ACT IN AERS: 2.0000 | INHALATION_SPRAY | RESPIRATORY_TRACT | 1 refills | Status: DC | PRN

## 2019-06-18 NOTE — Telephone Encounter (Signed)
Young, Larae Grooms, FNP 15 hours ago (5:16 PM)   The medication refill cant be for a 6 month supply, it must be written for 1 pump with 5 refills, health plan will not approve a 1 time 6 months.  This is for the ventolin  inhaler. Thank you  Spoke with pt resent in rx and will see if that will go thru

## 2019-08-11 ENCOUNTER — Other Ambulatory Visit: Payer: Self-pay

## 2019-08-11 ENCOUNTER — Telehealth: Payer: Self-pay | Admitting: Family Medicine

## 2019-08-11 MED ORDER — ALBUTEROL SULFATE HFA 108 (90 BASE) MCG/ACT IN AERS: 2.0000 | INHALATION_SPRAY | RESPIRATORY_TRACT | 1 refills | Status: DC | PRN

## 2019-08-11 MED ORDER — BUDESONIDE-FORMOTEROL FUMARATE 160-4.5 MCG/ACT IN AERO: INHALATION_SPRAY | RESPIRATORY_TRACT | 1 refills | Status: DC

## 2019-08-11 NOTE — Telephone Encounter (Signed)
Patient request refills for ventolin and symbicort. Pharmacy is [facility] [address]

## 2019-08-11 NOTE — Telephone Encounter (Signed)
Sent in refills for pt 

## 2019-12-15 ENCOUNTER — Ambulatory Visit (INDEPENDENT_AMBULATORY_CARE_PROVIDER_SITE_OTHER): Payer: Medicare Other | Admitting: Allergy and Immunology

## 2019-12-15 ENCOUNTER — Other Ambulatory Visit: Payer: Self-pay

## 2019-12-15 ENCOUNTER — Other Ambulatory Visit: Payer: Self-pay | Admitting: *Deleted

## 2019-12-15 ENCOUNTER — Encounter: Payer: Self-pay | Admitting: Allergy and Immunology

## 2019-12-15 ENCOUNTER — Telehealth: Payer: Self-pay | Admitting: Allergy and Immunology

## 2019-12-15 DIAGNOSIS — H1010 Acute atopic conjunctivitis, unspecified eye: Secondary | ICD-10-CM

## 2019-12-15 DIAGNOSIS — J3089 Other allergic rhinitis: Secondary | ICD-10-CM

## 2019-12-15 DIAGNOSIS — J4540 Moderate persistent asthma, uncomplicated: Secondary | ICD-10-CM | POA: Diagnosis not present

## 2019-12-15 MED ORDER — ALBUTEROL SULFATE HFA 108 (90 BASE) MCG/ACT IN AERS: 2.0000 | INHALATION_SPRAY | RESPIRATORY_TRACT | 0 refills | Status: DC | PRN

## 2019-12-15 MED ORDER — OLOPATADINE HCL 0.2 % OP SOLN
1.0000 [drp] | Freq: Every day | OPHTHALMIC | 5 refills | Status: DC | PRN
Start: 2019-12-15 — End: 2020-05-30

## 2019-12-15 MED ORDER — BUDESONIDE-FORMOTEROL FUMARATE 160-4.5 MCG/ACT IN AERO: INHALATION_SPRAY | RESPIRATORY_TRACT | 1 refills | Status: DC

## 2019-12-15 MED ORDER — ALBUTEROL SULFATE HFA 108 (90 BASE) MCG/ACT IN AERS
2.0000 | INHALATION_SPRAY | RESPIRATORY_TRACT | 3 refills | Status: DC | PRN
Start: 2019-12-15 — End: 2020-05-26

## 2019-12-15 MED ORDER — ALBUTEROL SULFATE 108 (90 BASE) MCG/ACT IN AEPB: 2.0000 | INHALATION_SPRAY | RESPIRATORY_TRACT | 0 refills | Status: DC | PRN

## 2019-12-15 NOTE — Assessment & Plan Note (Addendum)
Stable.  For now, continue Symbicort 160-4.5 g, 2 inhalations via spacer device twice daily.  Montelukast 10 mg daily at bedtime.  Continue albuterol 1 to 2 inhalations every 4-6 hours if needed.  May use albuterol 15 minutes prior to exercise.  Subjective and objective measures of pulmonary function will be followed and the treatment plan will be adjusted accordingly.

## 2019-12-15 NOTE — Assessment & Plan Note (Signed)
   Treatment plan as outlined above for allergic rhinitis.  A prescription has been provided for generic Pataday, one drop per eye daily as needed.  If insurance does not cover this medication, medicated allergy eyedrops may be purchased over-the-counter as Pataday Extra Strength or Zaditor.  I have also recommended eye lubricant drops (i.e., Natural Tears) as needed. 

## 2019-12-15 NOTE — Telephone Encounter (Signed)
Pt just wants the proair hfa not the respiclick so sent in the hfa proair pt will head to pharmacy in a little bit to get it

## 2019-12-15 NOTE — Patient Instructions (Addendum)
Moderate persistent asthma without complication Stable.  For now, continue Symbicort 160-4.5 g, 2 inhalations via spacer device twice daily.  Montelukast 10 mg daily at bedtime.  Continue albuterol 1 to 2 inhalations every 4-6 hours if needed.  May use albuterol 15 minutes prior to exercise.  Subjective and objective measures of pulmonary function will be followed and the treatment plan will be adjusted accordingly.  Allergic rhinitis  Continue appropriate allergen avoidance measures.  Continue cetirizine 10 mg daily if needed.  Continue fluticasone nasal spray, 1 to 2 sprays per nostril daily if needed.  Nasal saline spray (i.e., Simply Saline) or nasal saline lavage (i.e., NeilMed) is recommended as needed and prior to medicated nasal sprays.  Seasonal allergic conjunctivitis  Treatment plan as outlined above for allergic rhinitis.  A prescription has been provided for generic Pataday, one drop per eye daily as needed.  If insurance does not cover this medication, medicated allergy eyedrops may be purchased over-the-counter as Art therapist.  I have also recommended eye lubricant drops (i.e., Natural Tears) as needed.   Return in about 6 months (around 06/14/2020), or if symptoms worsen or fail to improve.

## 2019-12-15 NOTE — Progress Notes (Signed)
Follow-up Note  RE: Marcia Stevenson MRN: [id number] DOB: 01-Mar-1955 Date of Office Visit: 12/15/2019  Primary care provider: Gillian Scarce Referring provider: Piedad Climes [place], *  History of present illness: Marcia Stevenson is a 64 y.o. female with asthma and allergic rhinoconjunctivitis presented today for follow-up.  She was last seen in this clinic on June 10, 2019.  She reports that in the interval from her previous visit her asthma has been well controlled/stable.  She typically requires albuterol rescue 1 time per week on average, however may need the albuterol twice per week depending on the weather and how much she is exercising.  She is not using albuterol prior to exercise.  She has not been experiencing limitations in normal daily activities or nocturnal awakenings due to lower respiratory symptoms.  Her nasal allergy symptoms are reported as well controlled.  She does complain of occasional ocular pruritus.  However, she admits that she has not been using a medicated allergy eyedrop.  Assessment and plan: Moderate persistent asthma without complication Stable.  For now, continue Symbicort 160-4.5 g, 2 inhalations via spacer device twice daily.  Montelukast 10 mg daily at bedtime.  Continue albuterol 1 to 2 inhalations every 4-6 hours if needed.  May use albuterol 15 minutes prior to exercise.  Subjective and objective measures of pulmonary function will be followed and the treatment plan will be adjusted accordingly.  Allergic rhinitis  Continue appropriate allergen avoidance measures.  Continue cetirizine 10 mg daily if needed.  Continue fluticasone nasal spray, 1 to 2 sprays per nostril daily if needed.  Nasal saline spray (i.e., Simply Saline) or nasal saline lavage (i.e., NeilMed) is recommended as needed and prior to medicated nasal sprays.  Seasonal allergic conjunctivitis  Treatment plan as outlined above for allergic rhinitis.  A  prescription has been provided for generic Pataday, one drop per eye daily as needed.  If insurance does not cover this medication, medicated allergy eyedrops may be purchased over-the-counter as Art therapist.  I have also recommended eye lubricant drops (i.e., Natural Tears) as needed.   Meds ordered this encounter  Medications  . albuterol (VENTOLIN HFA) 108 (90 Base) MCG/ACT inhaler    Sig: Inhale 2 puffs into the lungs every 4 (four) hours as needed for wheezing or shortness of breath.    Dispense:  24 g    Refill:  0    Do not refill until 21 days has passed  . budesonide-formoterol (SYMBICORT) 160-4.5 MCG/ACT inhaler    Sig: Two puffs every 12 hours to prevent coughing or wheezing.    Dispense:  30.6 g    Refill:  1    Please dispense 90 day supply  . Olopatadine HCl (PATADAY) 0.2 % SOLN    Sig: Place 1 drop into both eyes daily as needed.    Dispense:  2.5 mL    Refill:  5    Diagnostics: Due to Covid precautions, spirometry was not performed today as the patient's symptoms are reported as well controlled and pulmonary exam was normal.    Physical examination: Blood pressure 132/70, pulse 99, temperature 98.4 F (36.9 C), temperature source Tympanic, resp. rate (!) 24, height 5\' 6"  (1.676 m), weight 205 lb 3.2 oz (93.1 kg), SpO2 97 %.  General: Alert, interactive, in no acute distress. HEENT: TMs pearly gray, turbinates mildly edematous without discharge, post-pharynx mildly erythematous. Neck: Supple without lymphadenopathy. Lungs: Clear to auscultation without wheezing, rhonchi or rales. CV: Normal S1,  S2 without murmurs. Skin: Warm and dry, without lesions or rashes.  The following portions of the patient's history were reviewed and updated as appropriate: allergies, current medications, past family history, past medical history, past social history, past surgical history and problem list.  Current Outpatient Medications  Medication Sig Dispense  Refill  . albuterol (PROVENTIL) (2.5 MG/3ML) 0.083% nebulizer solution Take 3 mLs (2.5 mg total) by nebulization every 4 (four) hours as needed for wheezing or shortness of breath. 300 mL 1  . albuterol (VENTOLIN HFA) 108 (90 Base) MCG/ACT inhaler Inhale 2 puffs into the lungs every 4 (four) hours as needed for wheezing or shortness of breath. 24 g 0  . aspirin 81 MG EC tablet Take by mouth.    Marland Kitchen atorvastatin (LIPITOR) 10 MG tablet Take 1 tablet by mouth once daily    . budesonide-formoterol (SYMBICORT) 160-4.5 MCG/ACT inhaler Two puffs every 12 hours to prevent coughing or wheezing. 30.6 g 1  . cetirizine (ZYRTEC) 10 MG tablet Take 1 tablet once a day as needed for runny nose or itchy eyes 90 tablet 1  . fluticasone (FLONASE) 50 MCG/ACT nasal spray 2 sprays per nostril once a day if needed for stuffy nose 16 g 5  . hydrOXYzine (ATARAX/VISTARIL) 10 MG tablet Take 1 tablet 2 hours before bedtime.    Marland Kitchen ibuprofen (ADVIL,MOTRIN) 800 MG tablet Take 800 mg by mouth.    Marland Kitchen lisinopril (PRINIVIL,ZESTRIL) 10 MG tablet TAKE ONE TABLET BY MOUTH ONCE DAILY 90 tablet 0  . montelukast (SINGULAIR) 10 MG tablet Take 1 tablet (10 mg total) by mouth at bedtime. 30 tablet 5  . nortriptyline (PAMELOR) 10 MG capsule Take by mouth.    . Omega-3 Fatty Acids (FISH OIL PO) Take by mouth.    . triamcinolone ointment (KENALOG) 0.1 % Apply 2 times a day to itchy areas on the body. Never to the face.    . Olopatadine HCl (PATADAY) 0.2 % SOLN Place 1 drop into both eyes daily as needed. 2.5 mL 5  . valACYclovir (VALTREX) 500 MG tablet  (Patient not taking: Reported on 12/15/2019)     No current facility-administered medications for this visit.    Allergies  Allergen Reactions  . Zoster Vaccine Live Hives    Herpes zoster Herpes zoster     I appreciate the opportunity to take part in Marcia Stevenson's care. Please do not hesitate to contact me with questions.  Sincerely,   R. Jorene Guest, MD

## 2019-12-15 NOTE — Telephone Encounter (Signed)
Pt calling says pharmacy gave her pro air when she requested ventolin hfa due to cheaper cost. Advise per our system albuterol ventolin hfa and budesonide symbicort were sent to pharmacy. Please follow up with patient

## 2019-12-15 NOTE — Assessment & Plan Note (Signed)
   Continue appropriate allergen avoidance measures.  Continue cetirizine 10 mg daily if needed.  Continue fluticasone nasal spray, 1 to 2 sprays per nostril daily if needed.  Nasal saline spray (i.e., Simply Saline) or nasal saline lavage (i.e., NeilMed) is recommended as needed and prior to medicated nasal sprays.

## 2020-05-26 ENCOUNTER — Telehealth: Payer: Self-pay | Admitting: Family Medicine

## 2020-05-26 MED ORDER — BUDESONIDE-FORMOTEROL FUMARATE 160-4.5 MCG/ACT IN AERO: INHALATION_SPRAY | RESPIRATORY_TRACT | 0 refills | Status: DC

## 2020-05-26 MED ORDER — ALBUTEROL SULFATE HFA 108 (90 BASE) MCG/ACT IN AERS
2.0000 | INHALATION_SPRAY | RESPIRATORY_TRACT | 0 refills | Status: DC | PRN
Start: 2020-05-26 — End: 2020-05-30

## 2020-05-26 NOTE — Telephone Encounter (Signed)
Patient has made an follow up appointment with Thermon Leyland for 05-30-2020  but prescriptions are expired asking for a refill on budesonide-formoterol [facility]) 160-4.5 MCG/ACT inhaler [[id number]]  And albuterol (VENTOLIN HFA) 108 (90 Base) MCG/ACT inhaler [[id number]] please advise

## 2020-05-26 NOTE — Telephone Encounter (Signed)
Courtesy refills sent to [facility].

## 2020-05-30 ENCOUNTER — Encounter: Payer: Self-pay | Admitting: Family Medicine

## 2020-05-30 ENCOUNTER — Ambulatory Visit (INDEPENDENT_AMBULATORY_CARE_PROVIDER_SITE_OTHER): Payer: Medicare Other | Admitting: Family Medicine

## 2020-05-30 ENCOUNTER — Other Ambulatory Visit: Payer: Self-pay

## 2020-05-30 VITALS — BP 130/70 | HR 96 | Temp 98.6°F | Resp 24 | Ht 66.0 in | Wt 211.8 lb

## 2020-05-30 DIAGNOSIS — J3089 Other allergic rhinitis: Secondary | ICD-10-CM | POA: Diagnosis not present

## 2020-05-30 DIAGNOSIS — J4541 Moderate persistent asthma with (acute) exacerbation: Secondary | ICD-10-CM

## 2020-05-30 DIAGNOSIS — H1013 Acute atopic conjunctivitis, bilateral: Secondary | ICD-10-CM

## 2020-05-30 DIAGNOSIS — H1010 Acute atopic conjunctivitis, unspecified eye: Secondary | ICD-10-CM

## 2020-05-30 MED ORDER — BUDESONIDE-FORMOTEROL FUMARATE 160-4.5 MCG/ACT IN AERO: INHALATION_SPRAY | RESPIRATORY_TRACT | 1 refills | Status: DC

## 2020-05-30 MED ORDER — SPIRIVA RESPIMAT 1.25 MCG/ACT IN AERS: INHALATION_SPRAY | RESPIRATORY_TRACT | 1 refills | Status: DC

## 2020-05-30 MED ORDER — ALBUTEROL SULFATE HFA 108 (90 BASE) MCG/ACT IN AERS: 2.0000 | INHALATION_SPRAY | RESPIRATORY_TRACT | 0 refills | Status: DC | PRN

## 2020-05-30 MED ORDER — MONTELUKAST SODIUM 10 MG PO TABS: 10.0000 mg | ORAL_TABLET | Freq: Every day | ORAL | 1 refills | Status: DC

## 2020-05-30 NOTE — Addendum Note (Signed)
Addended by: Hetty Blend on: 05/30/2020 04:32 PM   Modules accepted: Orders

## 2020-05-30 NOTE — Addendum Note (Signed)
Addended by: Vincent Peyer A on: 05/30/2020 04:31 PM   Modules accepted: Orders

## 2020-05-30 NOTE — Patient Instructions (Addendum)
Asthma Begin Spiriva 1.25 mcg-2 puffs once a day to prevent cough or wheeze Prednisone 10 mg tablets. Take 2 tablets once a day for 4 days, then take 1 tablet on the 5th day, then stop.  Restart montelukast 10 mg once a day to prevent cough or wheeze Continue Symbicort 160-2 puffs twice a day with a spacer to prevent cough or wheeze Continue albuterol 2 puffs every 4 hours as needed for cough or wheeze or instead, albuterol 0.083% 1 unit vial via nebulizer once every 4 hours as needed for cough or wheeze  Allergic rhinitis Begin Xyzal 5 mg once a day as needed for a runny nose or itch. Consider saline nasal rinses as needed for nasal symptoms. Use this before any medicated nasal sprays for best result Increase Flonase 2 sprays in each nostril once a day as needed for stuffy nose.  In the right nostril, point the applicator out toward the right ear. In the left nostril, point the applicator out toward the left ear Consider saline nasal rinses as needed for nasal symptoms. Use this before any medicated nasal sprays for best result For thick post nasal drainage, begin Mucinex [phone] mg twice a day as needed Continue allergen avoidance measures directed toward pollen as listed below  Allergic conjunctivitis Begin Pataday eye drops one drop in each eye once a day as needed for red, itchy eyes. Another over the counter allergy eye drop you can use is Zaditor one drop in each eye twice a day as needed for red itchy eyes.  Reflux Continue dietary and lifestyle modifications as listed below  Call the clinic if this treatment plan is not working well for you  Follow up in 2 months or sooner if needed.  Reducing Pollen Exposure The American Academy of [employer] suggests the following steps to reduce your exposure to pollen during allergy seasons. 1. Do not hang sheets or clothing out to dry; pollen may collect on these items. 2. Do not mow lawns or spend time around freshly cut  grass; mowing stirs up pollen. 3. Keep windows closed at night.  Keep car windows closed while driving. 4. Minimize morning activities outdoors, a time when pollen counts are usually at their highest. 5. Stay indoors as much as possible when pollen counts or humidity is high and on windy days when pollen tends to remain in the air longer. 6. Use air conditioning when possible.  Many air conditioners have filters that trap the pollen spores. 7. Use a HEPA room air filter to remove pollen form the indoor air you breathe.   Lifestyle Changes for Controlling GERD When you have GERD, stomach acid feels as if it's backing up toward your mouth. Whether or not you take medication to control your GERD, your symptoms can often be improved with lifestyle changes.   Raise Your Head  Reflux is more likely to strike when you're lying down flat, because stomach fluid can  flow backward more easily. Raising the head of your bed 4-6 inches can help. To do this:  Slide blocks or books under the legs at the head of your bed. Or, place a wedge under  the mattress. Many foam stores can make a suitable wedge for you. The wedge  should run from your waist to the top of your head.  Don't just prop your head on several pillows. This increases pressure on your  stomach. It can make GERD worse.  Watch Your Eating Habits Certain foods may increase  the acid in your stomach or relax the lower esophageal sphincter, making GERD more likely. It's best to avoid the following:  Coffee, tea, and carbonated drinks (with and without caffeine)  Fatty, fried, or spicy food  Mint, chocolate, onions, and tomatoes  Any other foods that seem to irritate your stomach or cause you pain  Relieve the Pressure  Eat smaller meals, even if you have to eat more often.  Don't lie down right after you eat. Wait a few hours for your stomach to empty.  Avoid tight belts and tight-fitting clothes.  Lose excess  weight.  Tobacco and Alcohol  Avoid smoking tobacco and drinking alcohol. They can make GERD symptoms worse.

## 2020-05-30 NOTE — Progress Notes (Signed)
[address] [place] [address] Dept: [phone]  FOLLOW UP NOTE  Patient ID: Marcia Stevenson, female    DOB: 06-17-1955  Age: 65 y.o. MRN: [id number] Date of Office Visit: 05/30/2020  Assessment  Chief Complaint: Allergic Rhinitis , Cough, and Wheezing  HPI Marcia Stevenson is a 65 year old female who presents to the clinic for follow-up visit.  She was last seen in this clinic on 12/15/2019 by Dr. Nunzio Cobbs for evaluation of asthma, allergic rhinitis, and allergic conjunctivitis.  At today's visit, she reports that her asthma has been poorly controlled for the last 2 months with symptoms including chest tightness, wheezing occurring in the evening while she is sitting up and while she is lying down as well as cough producing mucus that occurs year-round and gets worse in March.  She reports that she is out of montelukast for at least 1 month.  She continues Symbicort 160-2 puffs twice a day with a spacer and for the last week she has been using albuterol at least 2 times a day.  Previous to last week she rarely used albuterol.  Allergic rhinitis is reported as moderately well controlled with occasional sneeze and itching around her nostrils.  She rarely uses Flonase and reports some relief with this medication.  She is not taking an antihistamine as she reports these do not work for her nor is she using a saline nasal rinse.  Allergic conjunctivitis is reported as poorly controlled with red and itchy eyes for which she is not using any medical intervention at this time.  She denies symptoms of reflux including heartburn or vomiting and is not currently taking a medication to control reflux.  Her current medications are listed in the chart.   Drug Allergies:  Allergies  Allergen Reactions  . Zoster Vaccine Live Hives    Herpes zoster Herpes zoster     Physical Exam: BP 130/70 (BP Location: Left Arm, Patient Position: Sitting, Cuff Size: Normal)   Pulse 96   Temp 98.6 F (37 C)  (Temporal)   Resp (!) 24   Ht 5\' 6"  (1.676 m)   Wt 211 lb 12.8 oz (96.1 kg)   SpO2 97%   BMI 34.19 kg/m    Physical Exam Vitals reviewed.  Constitutional:      Appearance: Normal appearance.  HENT:     Head: Normocephalic and atraumatic.     Right Ear: Tympanic membrane normal.     Left Ear: Tympanic membrane normal.     Nose:     Comments: Bilateral nares edematous and pale with clear nasal drainage noted.  Pharynx normal.  Ears normal.  Eyes normal.    Mouth/Throat:     Pharynx: Oropharynx is clear.  Eyes:     Conjunctiva/sclera: Conjunctivae normal.  Cardiovascular:     Rate and Rhythm: Normal rate and regular rhythm.     Heart sounds: Normal heart sounds. No murmur heard.   Pulmonary:     Effort: Pulmonary effort is normal.     Breath sounds: Normal breath sounds.     Comments: Lungs clear to auscultation Musculoskeletal:        General: Normal range of motion.     Cervical back: Normal range of motion and neck supple.  Skin:    General: Skin is warm and dry.  Neurological:     Mental Status: She is alert and oriented to person, place, and time.  Psychiatric:        Mood and Affect: Mood normal.  Behavior: Behavior normal.        Thought Content: Thought content normal.        Judgment: Judgment normal.     Diagnostics: FVC 2.76, FEV1 1.92.  Predicted FVC 2.75, predicted FEV1 2.16.  Spirometry indicates normal ventilatory function.  Postbronchodilator therapy FVC 2.58, FEV1 1.94.  Spirometry indicates no significant improvement postbronchodilator therapy.  Assessment and Plan: 1. Moderate persistent asthma with acute exacerbation   2. Seasonal allergic conjunctivitis   3. Allergic rhinitis     Meds ordered this encounter  Medications  . albuterol (VENTOLIN HFA) 108 (90 Base) MCG/ACT inhaler    Sig: Inhale 2 puffs into the lungs every 4 (four) hours as needed for wheezing or shortness of breath.    Dispense:  3 each    Refill:  0    Dispense 90  day supply.  . montelukast (SINGULAIR) 10 MG tablet    Sig: Take 1 tablet (10 mg total) by mouth at bedtime.    Dispense:  90 tablet    Refill:  1    Please dispense 90 day supply.  . budesonide-formoterol (SYMBICORT) 160-4.5 MCG/ACT inhaler    Sig: Two puffs every 12 hours to prevent coughing or wheezing.    Dispense:  3 each    Refill:  1    Please dispense 90 day supply.    Patient Instructions  Asthma Begin Spiriva 1.25 mcg-2 puffs once a day to prevent cough or wheeze Prednisone 10 mg tablets. Take 2 tablets once a day for 4 days, then take 1 tablet on the 5th day, then stop.  Restart montelukast 10 mg once a day to prevent cough or wheeze Continue Symbicort 160-2 puffs twice a day with a spacer to prevent cough or wheeze Continue albuterol 2 puffs every 4 hours as needed for cough or wheeze or instead, albuterol 0.083% 1 unit vial via nebulizer once every 4 hours as needed for cough or wheeze  Allergic rhinitis Begin Xyzal 5 mg once a day as needed for a runny nose or itch. Consider saline nasal rinses as needed for nasal symptoms. Use this before any medicated nasal sprays for best result Increase Flonase 2 sprays in each nostril once a day as needed for stuffy nose.  In the right nostril, point the applicator out toward the right ear. In the left nostril, point the applicator out toward the left ear Consider saline nasal rinses as needed for nasal symptoms. Use this before any medicated nasal sprays for best result For thick post nasal drainage, begin Mucinex [phone] mg twice a day as needed Continue allergen avoidance measures directed toward pollen as listed below Allergic conjunctivitis Begin Pataday eye drops one drop in each eye once a day as needed for red, itchy eyes. Another over the counter allergy eye drop you can use is Zaditor one drop in each eye twice a day as needed for red itchy eyes.  Reflux Continue dietary and lifestyle modifications as listed below  Call  the clinic if this treatment plan is not working well for you  Follow up in 2 months or sooner if needed.   Return in about 2 months (around 07/30/2020), or if symptoms worsen or fail to improve.    Thank you for the opportunity to care for this patient.  Please do not hesitate to contact me with questions.  Thermon Leyland, FNP [facility]

## 2020-06-11 ENCOUNTER — Emergency Department (HOSPITAL_BASED_OUTPATIENT_CLINIC_OR_DEPARTMENT_OTHER): Payer: Medicare Other

## 2020-06-11 ENCOUNTER — Encounter (HOSPITAL_BASED_OUTPATIENT_CLINIC_OR_DEPARTMENT_OTHER): Payer: Self-pay | Admitting: Urology

## 2020-06-11 ENCOUNTER — Other Ambulatory Visit: Payer: Self-pay

## 2020-06-11 ENCOUNTER — Emergency Department (HOSPITAL_BASED_OUTPATIENT_CLINIC_OR_DEPARTMENT_OTHER)
Admission: EM | Admit: 2020-06-11 | Discharge: 2020-06-11 | Disposition: A | Discharge status: Home / Self Care | Payer: Medicare Other | Attending: Emergency Medicine | Admitting: Emergency Medicine

## 2020-06-11 DIAGNOSIS — R072 Precordial pain: Secondary | ICD-10-CM | POA: Insufficient documentation

## 2020-06-11 DIAGNOSIS — Z7982 Long term (current) use of aspirin: Secondary | ICD-10-CM | POA: Diagnosis not present

## 2020-06-11 DIAGNOSIS — Z7951 Long term (current) use of inhaled steroids: Secondary | ICD-10-CM | POA: Diagnosis not present

## 2020-06-11 DIAGNOSIS — I10 Essential (primary) hypertension: Secondary | ICD-10-CM | POA: Diagnosis not present

## 2020-06-11 DIAGNOSIS — J45909 Unspecified asthma, uncomplicated: Secondary | ICD-10-CM | POA: Insufficient documentation

## 2020-06-11 DIAGNOSIS — R0789 Other chest pain: Secondary | ICD-10-CM

## 2020-06-11 DIAGNOSIS — Z79899 Other long term (current) drug therapy: Secondary | ICD-10-CM | POA: Diagnosis not present

## 2020-06-11 DIAGNOSIS — R079 Chest pain, unspecified: Secondary | ICD-10-CM | POA: Diagnosis present

## 2020-06-11 LAB — CBC WITH DIFFERENTIAL/PLATELET
Abs Immature Granulocytes: 0.03 10*3/uL (ref 0.00–0.07)
Basophils Absolute: 0 10*3/uL (ref 0.0–0.1)
Basophils Relative: 1 %
Eosinophils Absolute: 0.3 10*3/uL (ref 0.0–0.5)
Eosinophils Relative: 5 %
HCT: 37.2 % (ref 36.0–46.0)
Hemoglobin: 12.8 g/dL (ref 12.0–15.0)
Immature Granulocytes: 1 %
Lymphocytes Relative: 37 %
Lymphs Abs: 2.4 10*3/uL (ref 0.7–4.0)
MCH: 28.3 pg (ref 26.0–34.0)
MCHC: 34.4 g/dL (ref 30.0–36.0)
MCV: 82.3 fL (ref 80.0–100.0)
Monocytes Absolute: 0.5 10*3/uL (ref 0.1–1.0)
Monocytes Relative: 8 %
Neutro Abs: 3.2 10*3/uL (ref 1.7–7.7)
Neutrophils Relative %: 48 %
Platelets: 243 10*3/uL (ref 150–400)
RBC: 4.52 MIL/uL (ref 3.87–5.11)
RDW: 13.5 % (ref 11.5–15.5)
WBC: 6.4 10*3/uL (ref 4.0–10.5)
nRBC: 0 % (ref 0.0–0.2)

## 2020-06-11 LAB — COMPREHENSIVE METABOLIC PANEL
ALT: 24 U/L (ref 0–44)
AST: 24 U/L (ref 15–41)
Albumin: 4.1 g/dL (ref 3.5–5.0)
Alkaline Phosphatase: 86 U/L (ref 38–126)
Anion gap: 8 (ref 5–15)
BUN: 12 mg/dL (ref 8–23)
CO2: 25 mmol/L (ref 22–32)
Calcium: 9.3 mg/dL (ref 8.9–10.3)
Chloride: 106 mmol/L (ref 98–111)
Creatinine, Ser: 0.81 mg/dL (ref 0.44–1.00)
GFR, Estimated: >60 mL/min (ref >60)
Glucose, Bld: 105 mg/dL — ABNORMAL HIGH (ref 70–99)
Potassium: 3.9 mmol/L (ref 3.5–5.1)
Sodium: 139 mmol/L (ref 135–145)
Total Bilirubin: 0.2 mg/dL — ABNORMAL LOW (ref 0.3–1.2)
Total Protein: 7.5 g/dL (ref 6.5–8.1)

## 2020-06-11 LAB — TROPONIN I (HIGH SENSITIVITY)
Troponin I (High Sensitivity): 3 ng/L (ref <18)
Troponin I (High Sensitivity): 3 ng/L (ref <18)

## 2020-06-11 LAB — LIPASE, BLOOD: Lipase: 26 U/L (ref 11–51)

## 2020-06-11 NOTE — Discharge Instructions (Addendum)
Follow-up with your primary care doctor for further evaluation.  Return to the ED as needed for worsening symptoms

## 2020-06-11 NOTE — ED Triage Notes (Signed)
Intermittent chest pain only at night lasting a few minutes and then subsiding, no chest pain at this time. Denies any SOB

## 2020-06-11 NOTE — ED Provider Notes (Signed)
[facility] EM[facility]GENCY DEPARTMENT Provider Note   CSN: [id number] Arrival date & time: 06/11/20  1243     History Chief Complaint  Patient presents with  . Chest Pain    Marcia Stevenson is a 65 y.o. female.  Patient presents chief complaint of chest pain.  Describes it as mid chest discomfort that woke her up at 2 AM lasted about 10 to 15 minutes and then resolved.  She describes it as pressure-like sensation nonradiating.  Yesterday she had similar episode of chest pain that woke her up at 2 AM and lasted about 10 minutes and then resolved.  Today she went to urgent care and was told to go to the ER for further cardiac work-up.  She states that since the morning episode she has not had any additional chest pain or shortness of breath or fevers or cough or vomiting or diarrhea.        Past Medical History:  Diagnosis Date  . Allergy    cigarette smoke, pollen  . Asthma    cough  . Chicken pox as a child  . Hyperlipidemia   . Hypertension   . Insomnia 11/16/2012  . Measles as a child  . Myalgia and myositis 12/06/2013  . Obesity, unspecified 11/16/2012  . Personal history of colonic polyps 11/16/2012   1 polyp colonoscopy 2012  . Preventative health care 12/06/2013  . Sickle cell trait [facility])     Patient Active Problem List   Diagnosis Date Noted  . Obesity (BMI 30.0-34.9) 06/17/2018  . Seasonal allergic conjunctivitis 12/10/2017  . Spondylolisthesis, lumbar region 03/26/2016  . Allergic reaction caused by a drug 08/30/2015  . Herpes zoster without complication 84/69/6295  . Post-vaccination reaction 08/30/2015  . Hyperglycemia 07/29/2015  . Moderate persistent asthma without complication 28/41/3244  . Seasonal allergic rhinitis due to pollen 06/16/2015  . Internal hemorrhoids 02/07/2015  . Discoloration of skin of lower leg 01/24/2015  . Allergic rhinitis 08/11/2014  . Leg pain, bilateral 05/26/2014  . Lumbar degenerative disc disease 05/26/2014  .  Radiculopathy of lumbar region 04/22/2014  . Preventative health care 12/06/2013  . Hypercholesteremia 12/06/2013  . Myalgia and myositis 12/06/2013  . Morbid obesity due to excess calories ([place]) 11/16/2012  . Insomnia 11/16/2012  . History of colonic polyps 11/16/2012  . Essential hypertension   . Asthma   . Allergy   . Sickle cell trait [facility])     Past Surgical History:  Procedure Laterality Date  . ABDOMINAL HYSTERECTOMY  01-08-2009   total  . [place]  . gallstones removed  1987  . KNEE SURGERY  1993   left- arthoscopy     OB History   No obstetric history on file.     Family History  Problem Relation Age of Onset  . Cancer Mother 41       ovarian  . Hypertension Mother   . Deep vein thrombosis Father   . Hypertension Sister   . Gout Sister   . Sickle cell trait Sister   . Allergic rhinitis Sister   . Asthma Son   . Cataracts Son   . Sickle cell trait Brother   . Sickle cell anemia Brother 24  . Sickle cell trait Brother   . Sickle cell trait Sister   . Allergic rhinitis Sister   . Sickle cell trait Brother   . Angioedema Neg Hx   . Eczema Neg Hx   . Immunodeficiency Neg Hx   . Urticaria Neg  Hx     Social History   Tobacco Use  . Smoking status: Never Smoker  . Smokeless tobacco: Never Used  Vaping Use  . Vaping Use: Never used  Substance Use Topics  . Alcohol use: No  . Drug use: No    Home Medications Prior to Admission medications   Medication Sig Start Date End Date Taking? Authorizing Provider  albuterol (PROVENTIL) (2.5 MG/3ML) 0.083% nebulizer solution Take 3 mLs (2.5 mg total) by nebulization every 4 (four) hours as needed for wheezing or shortness of breath. 06/10/19   Dara Hoyer, FNP  albuterol (VENTOLIN HFA) 108 (90 Base) MCG/ACT inhaler Inhale 2 puffs into the lungs every 4 (four) hours as needed for wheezing or shortness of breath. 05/30/20   Ambs, Kathrine Cords, FNP  aspirin 81 MG EC tablet Take by mouth.    [provider]  atorvastatin (LIPITOR) 10 MG tablet Take 1 tablet by mouth once daily 12/12/18   [provider]  Bartolo Darter COVID-19 AG HOME TEST KIT USE AS DIRECTED FOR 30 DAYS Patient not taking: Reported on 05/30/2020 04/18/20   [provider]  budesonide-formoterol (SYMBICORT) 160-4.5 MCG/ACT inhaler Two puffs every 12 hours to prevent coughing or wheezing. 05/30/20   Ambs, Kathrine Cords, FNP  cetirizine (ZYRTEC) 10 MG tablet Take 1 tablet once a day as needed for runny nose or itchy eyes 06/10/19   Ambs, Kathrine Cords, FNP  chlorhexidine (PERIDEX) 0.12 % solution 15 mLs 2 (two) times daily. 03/16/20   [provider]  diclofenac (VOLTAREN) 50 MG EC tablet Take by mouth.    [provider]  diclofenac Sodium (VOLTAREN) 1 % GEL Apply topically 4 (four) times daily.    [provider]  fluticasone (FLONASE) 50 MCG/ACT nasal spray 2 sprays per nostril once a day if needed for stuffy nose 06/17/18   Charlies Silvers, MD  gabapentin (NEURONTIN) 300 MG capsule Take by mouth. 04/28/20   [provider]  hydrOXYzine (ATARAX/VISTARIL) 10 MG tablet Take 1 tablet 2 hours before bedtime. 03/05/19   [provider]  ibuprofen (ADVIL,MOTRIN) 800 MG tablet Take 800 mg by mouth. 03/26/16   [provider]  lisinopril (PRINIVIL,ZESTRIL) 10 MG tablet TAKE ONE TABLET BY MOUTH ONCE DAILY 05/25/14   Mosie Lukes, MD  montelukast (SINGULAIR) 10 MG tablet Take 1 tablet (10 mg total) by mouth at bedtime. 05/30/20   Dara Hoyer, FNP  nortriptyline (PAMELOR) 10 MG capsule Take by mouth. 11/11/18   [provider]  Omega-3 Fatty Acids (FISH OIL PO) Take by mouth.    [provider]  Tiotropium Bromide Monohydrate (SPIRIVA RESPIMAT) 1.25 MCG/ACT AERS 2 puffs once daily in the morning for coughing. 05/30/20   Ambs, Kathrine Cords, FNP  traMADol (ULTRAM) 50 MG tablet SMARTSIG:1 Tablet(s) By Mouth Every 0-6 Hours PRN 03/12/20   [provider]  triamcinolone  ointment (KENALOG) 0.1 % Apply 2 times a day to itchy areas on the body. Never to the face. 03/05/19   [provider]  valACYclovir (VALTREX) 500 MG tablet  11/22/16   [provider]  zonisamide (ZONEGRAN) 25 MG capsule Take by mouth. 04/19/20   [provider]    Allergies    Zoster vaccine live  Review of Systems   Review of Systems  Constitutional: Negative for fever.  HENT: Negative for ear pain.   Eyes: Negative for pain.  Respiratory: Negative for cough.   Cardiovascular: Positive for chest pain.  Gastrointestinal:  Negative for abdominal pain.  Genitourinary: Negative for flank pain.  Musculoskeletal: Negative for back pain.  Skin: Negative for rash.  Neurological: Negative for headaches.    Physical Exam Updated Vital Signs BP 112/67 (BP Location: Right Arm)   Pulse 81   Temp 98.6 F (37 C) (Oral)   Resp 18   Ht _0  (1.651 m)   Wt 95.3 kg   SpO2 100%   BMI 34.95 kg/m   Physical Exam Constitutional:      General: She is not in acute distress.    Appearance: Normal appearance.  HENT:     Head: Normocephalic.     Nose: Nose normal.  Eyes:     Extraocular Movements: Extraocular movements intact.  Cardiovascular:     Rate and Rhythm: Normal rate.  Pulmonary:     Effort: Pulmonary effort is normal.  Musculoskeletal:        General: Normal range of motion.     Cervical back: Normal range of motion.     Right lower leg: No edema.     Left lower leg: No edema.  Neurological:     General: No focal deficit present.     Mental Status: She is alert. Mental status is at baseline.     ED Results / Procedures / Treatments   Labs (all labs ordered are listed, but only abnormal results are displayed) Labs Reviewed  COMPREHENSIVE METABOLIC PANEL - Abnormal; Notable for the following components:      Result Value   Glucose, Bld 105 (*)    Total Bilirubin 0.2 (*)    All other components within normal limits  CBC WITH  DIFFERENTIAL/PLATELET  LIPASE, BLOOD  TROPONIN I (HIGH SENSITIVITY)  TROPONIN I (HIGH SENSITIVITY)    EKG EKG Interpretation  Date/Time:  Saturday June 11 2020 12:57:18 EDT Ventricular Rate:  97 PR Interval:  147 QRS Duration: 88 QT Interval:  355 QTC Calculation: 451 R Axis:   66 Text Interpretation: Sinus rhythm Confirmed by Thamas Jaegers ([id number]) on 06/11/2020 12:58:30 PM   Radiology No results found.  Procedures Procedures   Medications Ordered in ED Medications - No data to display  ED Course  I have reviewed the triage vital signs and the nursing notes.  Pertinent labs & imaging results that were available during my care of the patient were reviewed by me and considered in my medical decision making (see chart for details).    MDM Rules/Calculators/A&P                          Patient is otherwise Wells criteria negative for significant concern for DVT or pulm embolism.  EKG is unremarkable normal sinus rhythm no ST elevations depressions no T wave inversions noted.  Initial troponin is negative, second troponin sent and pending.  Anticipate a negative study and outpatient follow-up  Will be signed out to oncoming physician provider.  Final Clinical Impression(s) / ED Diagnoses Final diagnoses:  Midsternal chest pain    Rx / DC Orders ED Discharge Orders    None       Luna Fuse, MD 06/11/20 [id number]

## 2020-06-11 NOTE — ED Notes (Signed)
ED Provider at bedside. 

## 2020-08-01 ENCOUNTER — Other Ambulatory Visit: Payer: Self-pay

## 2020-08-01 ENCOUNTER — Encounter: Payer: Self-pay | Admitting: Family Medicine

## 2020-08-01 ENCOUNTER — Ambulatory Visit (INDEPENDENT_AMBULATORY_CARE_PROVIDER_SITE_OTHER): Payer: Medicare Other | Admitting: Family Medicine

## 2020-08-01 VITALS — BP 124/74 | HR 94 | Temp 97.7°F | Resp 16

## 2020-08-01 DIAGNOSIS — J301 Allergic rhinitis due to pollen: Secondary | ICD-10-CM

## 2020-08-01 DIAGNOSIS — K219 Gastro-esophageal reflux disease without esophagitis: Secondary | ICD-10-CM | POA: Insufficient documentation

## 2020-08-01 DIAGNOSIS — J4540 Moderate persistent asthma, uncomplicated: Secondary | ICD-10-CM

## 2020-08-01 DIAGNOSIS — H1013 Acute atopic conjunctivitis, bilateral: Secondary | ICD-10-CM | POA: Diagnosis not present

## 2020-08-01 DIAGNOSIS — H1010 Acute atopic conjunctivitis, unspecified eye: Secondary | ICD-10-CM

## 2020-08-01 MED ORDER — LEVOCETIRIZINE DIHYDROCHLORIDE 5 MG PO TABS: 5.0000 mg | ORAL_TABLET | Freq: Every evening | ORAL | 1 refills | Status: DC

## 2020-08-01 MED ORDER — OMEPRAZOLE 20 MG PO CPDR: 20.0000 mg | DELAYED_RELEASE_CAPSULE | Freq: Every day | ORAL | 1 refills | Status: AC

## 2020-08-01 NOTE — Progress Notes (Signed)
[address] [place] [address] Dept: [phone]  FOLLOW UP NOTE  Patient ID: Marcia Stevenson, female    DOB: 1955-12-18  Age: 65 y.o. MRN: [id number] Date of Office Visit: 08/01/2020  Assessment  Chief Complaint: Asthma  HPI Marcia Stevenson is a 65 year old female who presents to the clinic for follow-up visit.  She was last seen in this clinic on 05/30/2020 for evaluation of asthma, allergic rhinitis, allergic conjunctivitis, and reflux.  In the interim, she visited the emergency department with chest pain on 06/11/2020 where she had negative serial troponin levels, twelve-lead EKG within normal limits, and negative chest x-ray.  At today's visit, she reports her asthma has been well controlled with no shortness of breath or wheeze with activity or rest.  She does report intermittent cough which is mostly producing mucus.  She continues montelukast intermittently and uses Symbicort 160-2 puffs twice a day with a spacer and uses albuterol about once a week for rescue.  Allergic rhinitis is reported as moderately well controlled with symptoms including occasional sneeze and frequent throat clearing.  She is using Flonase as needed with poor application technique.  She is not currently using a nasal saline rinse or an antihistamine.  Allergic conjunctivitis is reported as poorly controlled with red and itchy eyes for which she is not using any medication at this time.  She denies symptoms of reflux including heartburn, vomiting, or sour taste in her mouth.  She does report that she had localized substernal pain 3 nights in a row before she went to the emergency department on 06/11/2020 for evaluation of that pain.  She is not currently taking a medication to control reflux.  She is concerned today that her Fitbit has indicated poor oxygen saturation overnight a few nights ago.  There are no concrete numbers to substantiate this.  She reports that she occasionally snores, does not wake up or gasp for  air during the night, and feels rested during the daytime.  Her current medications are listed in the chart.   Drug Allergies:  Allergies  Allergen Reactions   Zoster Vaccine Live Hives    Herpes zoster Herpes zoster     Physical Exam: BP 124/74 (BP Location: Left Arm, Patient Position: Sitting, Cuff Size: Large)   Pulse 94   Temp 97.7 F (36.5 C) (Tympanic)   Resp 16   SpO2 96%    Physical Exam Vitals reviewed.  Constitutional:      Appearance: Normal appearance.  HENT:     Head: Normocephalic and atraumatic.     Right Ear: Tympanic membrane normal.     Left Ear: Tympanic membrane normal.     Mouth/Throat:     Pharynx: Oropharynx is clear.     Comments: Bilateral nares edematous and pale with clear nasal drainage noted.  Pharynx normal.  Ears normal.  Eyes normal. Eyes:     Conjunctiva/sclera: Conjunctivae normal.  Cardiovascular:     Rate and Rhythm: Normal rate and regular rhythm.     Heart sounds: Normal heart sounds. No murmur heard. Pulmonary:     Effort: Pulmonary effort is normal.     Breath sounds: Normal breath sounds.     Comments: Lungs clear to auscultation Musculoskeletal:        General: Normal range of motion.     Cervical back: Normal range of motion and neck supple.  Skin:    General: Skin is warm and dry.  Neurological:     Mental Status: She is  alert and oriented to person, place, and time.  Psychiatric:        Mood and Affect: Mood normal.        Behavior: Behavior normal.        Thought Content: Thought content normal.        Judgment: Judgment normal.    Diagnostics: FVC 2.87, FEV1 2.11. Predicted FVC 2.77, predicted FEV1 2.16. Spirometry indicates normal ventilatory function.   Assessment and Plan: 1. Moderate persistent asthma without complication   2. Seasonal allergic rhinitis due to pollen   3. Seasonal allergic conjunctivitis   4. Gastroesophageal reflux disease, unspecified whether esophagitis present     Meds ordered this  encounter  Medications   levocetirizine (XYZAL) 5 MG tablet    Sig: Take 1 tablet (5 mg total) by mouth every evening.    Dispense:  90 tablet    Refill:  1    Please dispense 90 days supply.   omeprazole (PRILOSEC) 20 MG capsule    Sig: Take 1 capsule (20 mg total) by mouth daily.    Dispense:  30 capsule    Refill:  1     Patient Instructions  Asthma Continue montelukast 10 mg once a day to prevent cough or wheeze Continue Symbicort 160-2 puffs twice a day with a spacer to prevent cough or wheeze Continue albuterol 2 puffs every 4 hours as needed for cough or wheeze or instead, albuterol 0.083% 1 unit vial via nebulizer once every 4 hours as needed for cough or wheeze For asthma flare, restart Spiriva 1.25 mcg-2 puffs once a day for 2 weeks or until cough and wheeze free  Allergic rhinitis Begin Xyzal 5 mg once a day as needed for a runny nose or itch. Consider saline nasal rinses as needed for nasal symptoms. Use this before any medicated nasal sprays for best result Continue Flonase 2 sprays in each nostril once a day as needed for stuffy nose.  In the right nostril, point the applicator out toward the right ear. In the left nostril, point the applicator out toward the left ear Consider saline nasal rinses as needed for nasal symptoms. Use this before any medicated nasal sprays for best result For thick post nasal drainage, begin Mucinex [phone] mg twice a day as needed Continue allergen avoidance measures directed toward pollen as listed below  Allergic conjunctivitis Begin Pataday eye drops one drop in each eye once a day as needed for red, itchy eyes. Another over the counter allergy eye drop you can use is Zaditor one drop in each eye twice a day as needed for red itchy eyes.  Reflux Continue dietary and lifestyle modifications as listed below Begin omeprazole 20 mg once a day to prevent reflux  Call the clinic if this treatment plan is not working well for you  Follow  up in 3 months or sooner if needed.   Return in about 3 months (around 11/01/2020), or if symptoms worsen or fail to improve.    Thank you for the opportunity to care for this patient.  Please do not hesitate to contact me with questions.  Thermon Leyland, FNP [facility]

## 2020-08-01 NOTE — Patient Instructions (Addendum)
Asthma Continue montelukast 10 mg once a day to prevent cough or wheeze Continue Symbicort 160-2 puffs twice a day with a spacer to prevent cough or wheeze Continue albuterol 2 puffs every 4 hours as needed for cough or wheeze or instead, albuterol 0.083% 1 unit vial via nebulizer once every 4 hours as needed for cough or wheeze For asthma flare, restart Spiriva 1.25 mcg-2 puffs once a day for 2 weeks or until cough and wheeze free  Allergic rhinitis Begin Xyzal 5 mg once a day as needed for a runny nose or itch. Consider saline nasal rinses as needed for nasal symptoms. Use this before any medicated nasal sprays for best result Continue Flonase 2 sprays in each nostril once a day as needed for stuffy nose.  In the right nostril, point the applicator out toward the right ear. In the left nostril, point the applicator out toward the left ear Consider saline nasal rinses as needed for nasal symptoms. Use this before any medicated nasal sprays for best result For thick post nasal drainage, begin Mucinex [phone] mg twice a day as needed Continue allergen avoidance measures directed toward pollen as listed below  Allergic conjunctivitis Begin Pataday eye drops one drop in each eye once a day as needed for red, itchy eyes. Another over the counter allergy eye drop you can use is Zaditor one drop in each eye twice a day as needed for red itchy eyes.  Reflux Continue dietary and lifestyle modifications as listed below Begin omeprazole 20 mg once a day to prevent reflux  Call the clinic if this treatment plan is not working well for you  Follow up in 3 months or sooner if needed.  Reducing Pollen Exposure The American Academy of Allergy, Asthma and Immunology suggests the following steps to reduce your exposure to pollen during allergy seasons. Do not hang sheets or clothing out to dry; pollen may collect on these items. Do not mow lawns or spend time around freshly cut grass; mowing stirs up  pollen. Keep windows closed at night.  Keep car windows closed while driving. Minimize morning activities outdoors, a time when pollen counts are usually at their highest. Stay indoors as much as possible when pollen counts or humidity is high and on windy days when pollen tends to remain in the air longer. Use air conditioning when possible.  Many air conditioners have filters that trap the pollen spores. Use a HEPA room air filter to remove pollen form the indoor air you breathe.   Lifestyle Changes for Controlling GERD When you have GERD, stomach acid feels as if it's backing up toward your mouth. Whether or not you take medication to control your GERD, your symptoms can often be improved with lifestyle changes.   Raise Your Head Reflux is more likely to strike when you're lying down flat, because stomach fluid can flow backward more easily. Raising the head of your bed 4-6 inches can help. To do this: Slide blocks or books under the legs at the head of your bed. Or, place a wedge under the mattress. Many foam stores can make a suitable wedge for you. The wedge should run from your waist to the top of your head. Don't just prop your head on several pillows. This increases pressure on your stomach. It can make GERD worse.  Watch Your Eating Habits Certain foods may increase the acid in your stomach or relax the lower esophageal sphincter, making GERD more likely. It's best to avoid the following:  Coffee, tea, and carbonated drinks (with and without caffeine) Fatty, fried, or spicy food Mint, chocolate, onions, and tomatoes Any other foods that seem to irritate your stomach or cause you pain  Relieve the Pressure Eat smaller meals, even if you have to eat more often. Don't lie down right after you eat. Wait a few hours for your stomach to empty. Avoid tight belts and tight-fitting clothes. Lose excess weight.  Tobacco and Alcohol Avoid smoking tobacco and drinking alcohol. They  can make GERD symptoms worse.

## 2020-08-03 ENCOUNTER — Other Ambulatory Visit: Payer: Self-pay | Admitting: Family Medicine

## 2020-08-04 MED ORDER — ALBUTEROL SULFATE HFA 108 (90 BASE) MCG/ACT IN AERS: 2.0000 | INHALATION_SPRAY | RESPIRATORY_TRACT | 1 refills | Status: DC | PRN

## 2020-08-04 NOTE — Telephone Encounter (Signed)
Patient was last seen on 08/01/20. Rx has been sent in, pt. Next appt in 11/22.

## 2020-11-15 ENCOUNTER — Encounter: Payer: Self-pay | Admitting: Family Medicine

## 2020-11-15 ENCOUNTER — Ambulatory Visit (INDEPENDENT_AMBULATORY_CARE_PROVIDER_SITE_OTHER): Payer: Medicare Other | Admitting: Family Medicine

## 2020-11-15 ENCOUNTER — Other Ambulatory Visit: Payer: Self-pay

## 2020-11-15 VITALS — BP 126/80 | HR 100 | Temp 97.9°F | Resp 28

## 2020-11-15 DIAGNOSIS — H1010 Acute atopic conjunctivitis, unspecified eye: Secondary | ICD-10-CM

## 2020-11-15 DIAGNOSIS — J4540 Moderate persistent asthma, uncomplicated: Secondary | ICD-10-CM

## 2020-11-15 DIAGNOSIS — K219 Gastro-esophageal reflux disease without esophagitis: Secondary | ICD-10-CM | POA: Diagnosis not present

## 2020-11-15 DIAGNOSIS — J301 Allergic rhinitis due to pollen: Secondary | ICD-10-CM | POA: Diagnosis not present

## 2020-11-15 DIAGNOSIS — H1013 Acute atopic conjunctivitis, bilateral: Secondary | ICD-10-CM

## 2020-11-15 MED ORDER — CROMOLYN SODIUM 4 % OP SOLN: OPHTHALMIC | 1 refills | Status: DC

## 2020-11-15 MED ORDER — ALBUTEROL SULFATE HFA 108 (90 BASE) MCG/ACT IN AERS: 2.0000 | INHALATION_SPRAY | RESPIRATORY_TRACT | 1 refills | Status: DC | PRN

## 2020-11-15 MED ORDER — LEVOCETIRIZINE DIHYDROCHLORIDE 5 MG PO TABS: 5.0000 mg | ORAL_TABLET | Freq: Every evening | ORAL | 1 refills | Status: DC

## 2020-11-15 MED ORDER — BUDESONIDE-FORMOTEROL FUMARATE 160-4.5 MCG/ACT IN AERO: INHALATION_SPRAY | RESPIRATORY_TRACT | 1 refills | Status: DC

## 2020-11-15 NOTE — Progress Notes (Signed)
[address] [place] [address] Dept: [phone]  FOLLOW UP NOTE  Patient ID: Marcia Stevenson, female    DOB: 10-22-1955  Age: 65 y.o. MRN: [id number] Date of Office Visit: 11/15/2020  Assessment  Chief Complaint: Asthma  HPI Marcia Stevenson is a 65 year old female who presents to the clinic for follow-up visit.  She was last seen in this clinic on 08/01/2020 for evaluation of asthma, allergic rhinitis, allergic conjunctivitis, and reflux.  At today's visit, she reports her asthma has been much more well controlled with symptoms including occasional wheeze and occasional cough which is reported as sometimes dry and sometimes producing mucus.  She reports the symptoms are aggravated by weather changes.  She continues montelukast 10 mg once a day, Symbicort 160-2 puffs twice a day, and albuterol about once a week.  She has not needed to use Spiriva since her last visit to this clinic.  Allergic rhinitis is reported as well controlled with no symptoms including rhinorrhea, nasal congestion, sneezing, or postnasal drainage.  She occasionally uses Flonase with relief of symptoms.  Allergic conjunctivitis is reported as moderately well controlled with red and itchy eyes occurring over the last few days.  She reports that she has tried olopatadine and a medication that she received from her eye doctor with no relief of symptoms.  Reflux is reported as well controlled with no current medical intervention.  Her current medications are listed in the chart.   Drug Allergies:  Allergies  Allergen Reactions   Zoster Vaccine Live Hives    Herpes zoster Herpes zoster     Physical Exam: BP 126/80 (BP Location: Right Arm, Patient Position: Sitting, Cuff Size: Large)   Pulse 100   Temp 97.9 F (36.6 C) (Temporal)   Resp (!) 28   SpO2 97%    Physical Exam Vitals reviewed.  Constitutional:      Appearance: Normal appearance.  HENT:     Head: Normocephalic and atraumatic.     Right Ear:  Tympanic membrane normal.     Left Ear: Tympanic membrane normal.     Nose:     Comments: Bilateral nares slightly erythematous with clear nasal drainage noted.  Pharynx normal.  Ears normal.  Eyes normal.    Mouth/Throat:     Pharynx: Oropharynx is clear.  Eyes:     Conjunctiva/sclera: Conjunctivae normal.  Cardiovascular:     Rate and Rhythm: Normal rate and regular rhythm.     Heart sounds: Normal heart sounds. No murmur heard. Pulmonary:     Effort: Pulmonary effort is normal.     Breath sounds: Normal breath sounds.     Comments: Lungs clear to auscultation Musculoskeletal:        General: Normal range of motion.     Cervical back: Normal range of motion and neck supple.  Skin:    General: Skin is warm and dry.  Neurological:     Mental Status: She is alert and oriented to person, place, and time.  Psychiatric:        Mood and Affect: Mood normal.        Behavior: Behavior normal.        Thought Content: Thought content normal.        Judgment: Judgment normal.    Diagnostics: FVC 2.63, FEV1 2.06.  Predicted FVC 2.74, predicted FEV1 2.14.  Spirometry indicates normal ventilatory function.  Assessment and Plan: 1. Moderate persistent asthma without complication   2. Seasonal allergic rhinitis due to pollen  3. Seasonal allergic conjunctivitis   4. Gastroesophageal reflux disease, unspecified whether esophagitis present     Meds ordered this encounter  Medications   albuterol (VENTOLIN HFA) 108 (90 Base) MCG/ACT inhaler    Sig: Inhale 2 puffs into the lungs every 4 (four) hours as needed for wheezing or shortness of breath.    Dispense:  3 each    Refill:  1    Dispense 90 day supply.   budesonide-formoterol (SYMBICORT) 160-4.5 MCG/ACT inhaler    Sig: Two puffs every 12 hours to prevent coughing or wheezing.    Dispense:  3 each    Refill:  1    Please dispense 90 day supply.   levocetirizine (XYZAL) 5 MG tablet    Sig: Take 1 tablet (5 mg total) by mouth every  evening.    Dispense:  90 tablet    Refill:  1    Please dispense 90 days supply.   cromolyn (OPTICROM) 4 % ophthalmic solution    Sig: 1-2 drops in each eye four times daily as needed for itchy eyes    Dispense:  30 mL    Refill:  1    Please dispense 90 day supply     Patient Instructions  Asthma Continue montelukast 10 mg once a day to prevent cough or wheeze Continue Symbicort 160-2 puffs twice a day with a spacer to prevent cough or wheeze Continue albuterol 2 puffs every 4 hours as needed for cough or wheeze or instead, albuterol 0.083% 1 unit vial via nebulizer once every 4 hours as needed for cough or wheeze For asthma flare, restart Spiriva 1.25 mcg-2 puffs once a day for 2 weeks or until cough and wheeze free  Allergic rhinitis Continue an antihistamine one a day as needed for a runny nose or itch. Remember to rotate to a different antihistamine about every 3 months. Some examples of over the counter antihistamines include Zyrtec (cetirizine), Xyzal (levocetirizine), Allegra (fexofenadine), and Claritin (loratidine).  Continue Flonase 2 sprays in each nostril once a day as needed for stuffy nose.  In the right nostril, point the applicator out toward the right ear. In the left nostril, point the applicator out toward the left ear Consider saline nasal rinses as needed for nasal symptoms. Use this before any medicated nasal sprays for best result Continue allergen avoidance measures directed toward pollen as listed below  Allergic conjunctivitis Begin cromolyn eye drops 1-2 drops in each eye up to 4 times a day as needed for red or itchy eyes  Reflux Continue dietary and lifestyle modifications as listed below  Call the clinic if this treatment plan is not working well for you  Follow up in 6 months or sooner if needed.   Return in about 6 months (around 05/15/2021), or if symptoms worsen or fail to improve.    Thank you for the opportunity to care for this patient.   Please do not hesitate to contact me with questions.  Thermon Leyland, FNP [facility]

## 2020-11-15 NOTE — Patient Instructions (Addendum)
Asthma Continue montelukast 10 mg once a day to prevent cough or wheeze Continue Symbicort 160-2 puffs twice a day with a spacer to prevent cough or wheeze Continue albuterol 2 puffs every 4 hours as needed for cough or wheeze or instead, albuterol 0.083% 1 unit vial via nebulizer once every 4 hours as needed for cough or wheeze For asthma flare, restart Spiriva 1.25 mcg-2 puffs once a day for 2 weeks or until cough and wheeze free  Allergic rhinitis Continue an antihistamine one a day as needed for a runny nose or itch. Remember to rotate to a different antihistamine about every 3 months. Some examples of over the counter antihistamines include Zyrtec (cetirizine), Xyzal (levocetirizine), Allegra (fexofenadine), and Claritin (loratidine).  Continue Flonase 2 sprays in each nostril once a day as needed for stuffy nose.  In the right nostril, point the applicator out toward the right ear. In the left nostril, point the applicator out toward the left ear Consider saline nasal rinses as needed for nasal symptoms. Use this before any medicated nasal sprays for best result Continue allergen avoidance measures directed toward pollen as listed below  Allergic conjunctivitis Begin cromolyn eye drops 1-2 drops in each eye up to 4 times a day as needed for red or itchy eyes  Reflux Continue dietary and lifestyle modifications as listed below  Call the clinic if this treatment plan is not working well for you  Follow up in 6 months or sooner if needed.  Reducing Pollen Exposure The American Academy of Allergy, Asthma and Immunology suggests the following steps to reduce your exposure to pollen during allergy seasons. Do not hang sheets or clothing out to dry; pollen may collect on these items. Do not mow lawns or spend time around freshly cut grass; mowing stirs up pollen. Keep windows closed at night.  Keep car windows closed while driving. Minimize morning activities outdoors, a time when pollen  counts are usually at their highest. Stay indoors as much as possible when pollen counts or humidity is high and on windy days when pollen tends to remain in the air longer. Use air conditioning when possible.  Many air conditioners have filters that trap the pollen spores. Use a HEPA room air filter to remove pollen form the indoor air you breathe.   Lifestyle Changes for Controlling GERD When you have GERD, stomach acid feels as if it's backing up toward your mouth. Whether or not you take medication to control your GERD, your symptoms can often be improved with lifestyle changes.   Raise Your Head Reflux is more likely to strike when you're lying down flat, because stomach fluid can flow backward more easily. Raising the head of your bed 4-6 inches can help. To do this: Slide blocks or books under the legs at the head of your bed. Or, place a wedge under the mattress. Many foam stores can make a suitable wedge for you. The wedge should run from your waist to the top of your head. Don't just prop your head on several pillows. This increases pressure on your stomach. It can make GERD worse.  Watch Your Eating Habits Certain foods may increase the acid in your stomach or relax the lower esophageal sphincter, making GERD more likely. It's best to avoid the following: Coffee, tea, and carbonated drinks (with and without caffeine) Fatty, fried, or spicy food Mint, chocolate, onions, and tomatoes Any other foods that seem to irritate your stomach or cause you pain  Relieve the Pressure Eat  smaller meals, even if you have to eat more often. Don't lie down right after you eat. Wait a few hours for your stomach to empty. Avoid tight belts and tight-fitting clothes. Lose excess weight.  Tobacco and Alcohol Avoid smoking tobacco and drinking alcohol. They can make GERD symptoms worse.

## 2020-11-16 ENCOUNTER — Encounter: Payer: Self-pay | Admitting: Family Medicine

## 2021-04-19 ENCOUNTER — Encounter: Payer: Self-pay | Admitting: Family Medicine

## 2021-04-19 ENCOUNTER — Ambulatory Visit (INDEPENDENT_AMBULATORY_CARE_PROVIDER_SITE_OTHER): Payer: Medicare Other | Admitting: Family Medicine

## 2021-04-19 VITALS — BP 146/76 | HR 100 | Temp 98.2°F | Resp 12 | Ht 66.0 in | Wt 208.9 lb

## 2021-04-19 DIAGNOSIS — J301 Allergic rhinitis due to pollen: Secondary | ICD-10-CM

## 2021-04-19 DIAGNOSIS — K219 Gastro-esophageal reflux disease without esophagitis: Secondary | ICD-10-CM

## 2021-04-19 DIAGNOSIS — H1013 Acute atopic conjunctivitis, bilateral: Secondary | ICD-10-CM | POA: Diagnosis not present

## 2021-04-19 DIAGNOSIS — J4540 Moderate persistent asthma, uncomplicated: Secondary | ICD-10-CM

## 2021-04-19 DIAGNOSIS — H1010 Acute atopic conjunctivitis, unspecified eye: Secondary | ICD-10-CM

## 2021-04-19 MED ORDER — ALBUTEROL SULFATE HFA 108 (90 BASE) MCG/ACT IN AERS
2.0000 | INHALATION_SPRAY | RESPIRATORY_TRACT | 1 refills | Status: DC | PRN
Start: 2021-04-19 — End: 2022-04-17

## 2021-04-19 MED ORDER — ADULT MASK DEVI: 1 refills | Status: AC

## 2021-04-19 MED ORDER — BUDESONIDE-FORMOTEROL FUMARATE 160-4.5 MCG/ACT IN AERO: INHALATION_SPRAY | RESPIRATORY_TRACT | 1 refills | Status: DC

## 2021-04-19 MED ORDER — MONTELUKAST SODIUM 10 MG PO TABS: 10.0000 mg | ORAL_TABLET | Freq: Every day | ORAL | 1 refills | Status: DC

## 2021-04-19 NOTE — Patient Instructions (Addendum)
Asthma ?Restart montelukast 10 mg once a day to prevent cough or wheeze. Patient cautioned that rarely some children/adults can experience behavioral changes after beginning montelukast. These side effects are rare, however, if you notice any change, notify the clinic and discontinue montelukast. ?Continue Symbicort 160-2 puffs twice a day with a spacer to prevent cough or wheeze ?Continue albuterol 2 puffs every 4 hours as needed for cough or wheeze or instead, albuterol 0.083% 1 unit vial via nebulizer once every 4 hours as needed for cough or wheeze ?For now and for asthma flare, restart Spiriva 2.5 mcg-1 puff once a day for 2 weeks or until cough and wheeze free (sample provided) ? ?Allergic rhinitis ?Continue an antihistamine one a day as needed for a runny nose or itch. Remember to rotate to a different antihistamine about every 3 months. Some examples of over the counter antihistamines include Zyrtec (cetirizine), Xyzal (levocetirizine), Allegra (fexofenadine), and Claritin (loratidine).  ?Continue Flonase 2 sprays in each nostril once a day as needed for stuffy nose.  In the right nostril, point the applicator out toward the right ear. In the left nostril, point the applicator out toward the left ear ?Consider saline nasal rinses as needed for nasal symptoms. Use this before any medicated nasal sprays for best result ?Continue allergen avoidance measures directed toward pollen as listed below ? ?Allergic conjunctivitis ?Continue cromolyn eye drops 1-2 drops in each eye up to 4 times a day as needed for red or itchy eyes ? ?Reflux ?Continue dietary and lifestyle modifications as listed below ? ?Call the clinic if this treatment plan is not working well for you ? ?Follow up in 6 months or sooner if needed. ? ?Reducing Pollen Exposure ?The American Academy of Allergy, Asthma and Immunology suggests the following steps to reduce your exposure to pollen during allergy seasons. ?Do not hang sheets or clothing out  to dry; pollen may collect on these items. ?Do not mow lawns or spend time around freshly cut grass; mowing stirs up pollen. ?Keep windows closed at night.  Keep car windows closed while driving. ?Minimize morning activities outdoors, a time when pollen counts are usually at their highest. ?Stay indoors as much as possible when pollen counts or humidity is high and on windy days when pollen tends to remain in the air longer. ?Use air conditioning when possible.  Many air conditioners have filters that trap the pollen spores. ?Use a HEPA room air filter to remove pollen form the indoor air you breathe. ? ? ?Lifestyle Changes for Controlling GERD ?When you have GERD, stomach acid feels as if it?s backing up toward your mouth. ?Whether or not you take medication to control your GERD, your symptoms can often be ?improved with lifestyle changes.  ? ?Raise Your Head ?Reflux is more likely to strike when you?re lying down flat, because stomach fluid can ?flow backward more easily. Raising the head of your bed 4-6 inches can help. To do this: ?Slide blocks or books under the legs at the head of your bed. Or, place a wedge under ?the mattress. Many foam stores can make a suitable wedge for you. The wedge ?should run from your waist to the top of your head. ?Don?t just prop your head on several pillows. This increases pressure on your ?stomach. It can make GERD worse. ? ?Watch Your Eating Habits ?Certain foods may increase the acid in your stomach or relax the lower esophageal ?sphincter, making GERD more likely. It?s best to avoid the following: ?Coffee, tea,  and carbonated drinks (with and without caffeine) ?Fatty, fried, or spicy food ?Mint, chocolate, onions, and tomatoes ?Any other foods that seem to irritate your stomach or cause you pain ? ?Relieve the Pressure ?Eat smaller meals, even if you have to eat more often. ?Don?t lie down right after you eat. Wait a few hours for your stomach to empty. ?Avoid tight belts and  tight-fitting clothes. ?Lose excess weight. ? ?Tobacco and Alcohol ?Avoid smoking tobacco and drinking alcohol. They can make GERD symptoms worse. ? ?

## 2021-04-19 NOTE — Progress Notes (Signed)
? ?[place] [address] ?Dept: [phone] ? ?FOLLOW UP NOTE ? ?Patient ID: Marcia Stevenson, female    DOB: 05/27/1955  Age: 66 y.o. MRN: [id number] ?Date of Office Visit: 04/19/2021 ? ?Assessment  ?Chief Complaint: Follow-up (Patient is present for a follow up and medications refill. Patient states she have been sneezing and coughing.), Asthma, and Cough ? ?HPI ?Marcia Stevenson is a 66 year old female who presents the clinic for follow-up visit.  She was last seen in this clinic on 11/15/2020 by Gareth Morgan, for evaluation of asthma, allergic rhinitis, allergic conjunctivitis, and reflux.  She also has a past medical history of pruritus sickle cell trait, and stable meningioma.  At today's visit, she reports her asthma has been moderately well controlled with symptoms including occasional wheezing occurring in the morning and some cough producing mucus and infrequent posttussive vomiting.  She continues Symbicort 160-2 puffs twice a day with a spacer and has used albuterol 2-3 times over the last week with relief of symptoms.  She does report that she ran out of montelukast about 1 month ago.  She is not currently using Spiriva.  Prior to running out of montelukast her asthma symptoms had been well controlled.  Allergic rhinitis is reported as moderately well controlled with sneezing producing thick mucus as the main symptom.  She denies clear rhinorrhea, nasal congestion, and postnasal drainage.  She continues Flonase as needed and is not currently using nasal saline rinse.  Allergic conjunctivitis is reported as moderately well controlled with symptoms including red and itchy eyes for which she continues cromolyn as needed.  Reflux is reported as well controlled with no symptoms including heartburn or vomiting.  She is not currently taking omeprazole.  Her current medications are listed in the chart. ? ? ?Drug Allergies:  ?Allergies  ?Allergen Reactions  ? Zoster Vaccine Live Hives  ?  Herpes  zoster ?Herpes zoster ?  ? ? ?Physical Exam: ?BP (!) 146/76   Pulse 100   Temp 98.2 ?F (36.8 ?C)   Resp 12   Ht 5\' 6"  (1.676 m)   Wt 208 lb 14.4 oz (94.8 kg)   SpO2 100%   BMI 33.72 kg/m?   ? ?Physical Exam ?Vitals reviewed.  ?Constitutional:   ?   Appearance: Normal appearance.  ?HENT:  ?   Head: Normocephalic and atraumatic.  ?   Right Ear: Tympanic membrane normal.  ?   Left Ear: Tympanic membrane normal.  ?   Nose:  ?   Comments: Bilateral nares edematous and pale with clear nasal drainage noted.  Pharynx slightly erythematous with no exudate.  Ears normal.  Eyes normal. ?Eyes:  ?   Conjunctiva/sclera: Conjunctivae normal.  ?Cardiovascular:  ?   Rate and Rhythm: Normal rate and regular rhythm.  ?   Heart sounds: Normal heart sounds. No murmur heard. ?Pulmonary:  ?   Effort: Pulmonary effort is normal.  ?   Breath sounds: Normal breath sounds.  ?   Comments: Lungs clear to auscultation ?Musculoskeletal:     ?   General: Normal range of motion.  ?   Cervical back: Normal range of motion.  ?Skin: ?   General: Skin is warm and dry.  ?Neurological:  ?   Mental Status: She is alert and oriented to person, place, and time.  ?Psychiatric:     ?   Mood and Affect: Mood normal.     ?   Behavior: Behavior normal.     ?  Thought Content: Thought content normal.     ?   Judgment: Judgment normal.  ? ? ?Diagnostics: ?FVC 2.58, FEV1 1.65.  Predicted FVC 2.72, predicted FEV1 2.13.  Spirometry indicates possible obstruction.  Postbronchodilator FVC 2.32, FEV1 1.92.  Postbronchodilator spirometry indicates normal ventilatory function with 16% improvement in FEV1. ? ?Assessment and Plan: ?1. Not well controlled moderate persistent asthma   ?2. Seasonal allergic rhinitis due to pollen   ?3. Seasonal allergic conjunctivitis   ?4. Gastroesophageal reflux disease, unspecified whether esophagitis present   ? ? ?Meds ordered this encounter  ?Medications  ? albuterol (VENTOLIN HFA) 108 (90 Base) MCG/ACT inhaler  ?  Sig: Inhale 2  puffs into the lungs every 4 (four) hours as needed for wheezing or shortness of breath.  ?  Dispense:  3 each  ?  Refill:  1  ?  Dispense 90 day supply.  ? budesonide-formoterol (SYMBICORT) 160-4.5 MCG/ACT inhaler  ?  Sig: Two puffs every 12 hours to prevent coughing or wheezing.  ?  Dispense:  3 each  ?  Refill:  1  ?  Please dispense 90 day supply.  ? montelukast (SINGULAIR) 10 MG tablet  ?  Sig: Take 1 tablet (10 mg total) by mouth at bedtime.  ?  Dispense:  90 tablet  ?  Refill:  1  ?  Please dispense 90 day supply.  ? Respiratory Therapy Supplies (ADULT MASK) DEVI  ?  Sig: Nebulizer mask for adult  ?  Dispense:  1 each  ?  Refill:  1  ? ? ?Patient Instructions  ?Asthma ?Restart montelukast 10 mg once a day to prevent cough or wheeze. Patient cautioned that rarely some children/adults can experience behavioral changes after beginning montelukast. These side effects are rare, however, if you notice any change, notify the clinic and discontinue montelukast. ?Continue Symbicort 160-2 puffs twice a day with a spacer to prevent cough or wheeze ?Continue albuterol 2 puffs every 4 hours as needed for cough or wheeze or instead, albuterol 0.083% 1 unit vial via nebulizer once every 4 hours as needed for cough or wheeze ?For now and for asthma flare, restart Spiriva 2.5 mcg-1 puff once a day for 2 weeks or until cough and wheeze free (sample provided) ? ?Allergic rhinitis ?Continue an antihistamine one a day as needed for a runny nose or itch. Remember to rotate to a different antihistamine about every 3 months. Some examples of over the counter antihistamines include Zyrtec (cetirizine), Xyzal (levocetirizine), Allegra (fexofenadine), and Claritin (loratidine).  ?Continue Flonase 2 sprays in each nostril once a day as needed for stuffy nose.  In the right nostril, point the applicator out toward the right ear. In the left nostril, point the applicator out toward the left ear ?Consider saline nasal rinses as needed for  nasal symptoms. Use this before any medicated nasal sprays for best result ?Continue allergen avoidance measures directed toward pollen as listed below ? ?Allergic conjunctivitis ?Continue cromolyn eye drops 1-2 drops in each eye up to 4 times a day as needed for red or itchy eyes ? ?Reflux ?Continue dietary and lifestyle modifications as listed below ? ?Call the clinic if this treatment plan is not working well for you ? ?Follow up in 6 months or sooner if needed. ? ? ?Return in about 6 months (around 10/19/2021), or if symptoms worsen or fail to improve. ?  ? ?Thank you for the opportunity to care for this patient.  Please do not hesitate to contact me with  questions. ? ?Gareth Morgan, FNP ?[facility] ? ? ? ? ? ?

## 2021-08-23 ENCOUNTER — Ambulatory Visit (INDEPENDENT_AMBULATORY_CARE_PROVIDER_SITE_OTHER): Payer: Medicare Other | Admitting: Family Medicine

## 2021-08-23 ENCOUNTER — Encounter: Payer: Self-pay | Admitting: Family Medicine

## 2021-08-23 VITALS — BP 132/78 | HR 94 | Temp 98.0°F | Resp 18 | Wt 208.4 lb

## 2021-08-23 DIAGNOSIS — J301 Allergic rhinitis due to pollen: Secondary | ICD-10-CM

## 2021-08-23 DIAGNOSIS — H1010 Acute atopic conjunctivitis, unspecified eye: Secondary | ICD-10-CM

## 2021-08-23 DIAGNOSIS — J4541 Moderate persistent asthma with (acute) exacerbation: Secondary | ICD-10-CM

## 2021-08-23 DIAGNOSIS — H1013 Acute atopic conjunctivitis, bilateral: Secondary | ICD-10-CM

## 2021-08-23 DIAGNOSIS — K219 Gastro-esophageal reflux disease without esophagitis: Secondary | ICD-10-CM | POA: Diagnosis not present

## 2021-08-23 DIAGNOSIS — J4540 Moderate persistent asthma, uncomplicated: Secondary | ICD-10-CM

## 2021-08-23 MED ORDER — BREZTRI AEROSPHERE 160-9-4.8 MCG/ACT IN AERO: 2.0000 | INHALATION_SPRAY | Freq: Two times a day (BID) | RESPIRATORY_TRACT | 3 refills | Status: DC

## 2021-08-23 NOTE — Patient Instructions (Addendum)
Asthma Begin prednisone 10 mg tablets. Take 2 tablets once a day for 4 days, then take 1 tablet on the 5th day, then stop.   Continue montelukast 10 mg once a day to prevent cough or wheeze.  Begin Breztri 2 puffs twice a day with a spacer to prevent cough and wheeze. This will replace Symbicort Continue albuterol 2 puffs every 4 hours as needed for cough or wheeze or instead, albuterol 0.083% 1 unit vial via nebulizer once every 4 hours as needed for cough or wheeze Your last lab work indicates that you do qualify for a biologic medication to control your asthma. Handout given.   Allergic rhinitis Continue an antihistamine one a day as needed for a runny nose or itch. Remember to rotate to a different antihistamine about every 3 months. Some examples of over the counter antihistamines include Zyrtec (cetirizine), Xyzal (levocetirizine), Allegra (fexofenadine), and Claritin (loratidine).  Continue Flonase 2 sprays in each nostril once a day as needed for stuffy nose.  In the right nostril, point the applicator out toward the right ear. In the left nostril, point the applicator out toward the left ear Consider saline nasal rinses as needed for nasal symptoms. Use this before any medicated nasal sprays for best result Continue allergen avoidance measures directed toward pollen as listed below  Allergic conjunctivitis Continue cromolyn eye drops 1-2 drops in each eye up to 4 times a day as needed for red or itchy eyes  Reflux Continue dietary and lifestyle modifications as listed below Begin omeprazole 20 mg once a day to control reflux. Take this medication 30 minutes before your first meal for Call the clinic if this treatment plan is not working well for you  Follow up in 2 months or sooner if needed.  Reducing Pollen Exposure The American Academy of Allergy, Asthma and Immunology suggests the following steps to reduce your exposure to pollen during allergy seasons. Do not hang sheets or  clothing out to dry; pollen may collect on these items. Do not mow lawns or spend time around freshly cut grass; mowing stirs up pollen. Keep windows closed at night.  Keep car windows closed while driving. Minimize morning activities outdoors, a time when pollen counts are usually at their highest. Stay indoors as much as possible when pollen counts or humidity is high and on windy days when pollen tends to remain in the air longer. Use air conditioning when possible.  Many air conditioners have filters that trap the pollen spores. Use a HEPA room air filter to remove pollen form the indoor air you breathe.   Lifestyle Changes for Controlling GERD When you have GERD, stomach acid feels as if it's backing up toward your mouth. Whether or not you take medication to control your GERD, your symptoms can often be improved with lifestyle changes.   Raise Your Head Reflux is more likely to strike when you're lying down flat, because stomach fluid can flow backward more easily. Raising the head of your bed 4-6 inches can help. To do this: Slide blocks or books under the legs at the head of your bed. Or, place a wedge under the mattress. Many foam stores can make a suitable wedge for you. The wedge should run from your waist to the top of your head. Don't just prop your head on several pillows. This increases pressure on your stomach. It can make GERD worse.  Watch Your Eating Habits Certain foods may increase the acid in your stomach or relax the  lower esophageal sphincter, making GERD more likely. It's best to avoid the following: Coffee, tea, and carbonated drinks (with and without caffeine) Fatty, fried, or spicy food Mint, chocolate, onions, and tomatoes Any other foods that seem to irritate your stomach or cause you pain  Relieve the Pressure Eat smaller meals, even if you have to eat more often. Don't lie down right after you eat. Wait a few hours for your stomach to empty. Avoid  tight belts and tight-fitting clothes. Lose excess weight.  Tobacco and Alcohol Avoid smoking tobacco and drinking alcohol. They can make GERD symptoms worse.

## 2021-08-23 NOTE — Progress Notes (Signed)
[address] [place] [address] Dept: [phone]  FOLLOW UP NOTE  Patient ID: Marcia Stevenson, female    DOB: [birth year]  Age: 66 y.o. MRN: [id number] Date of Office Visit: 08/23/2021  Assessment  Chief Complaint: Asthma and Follow-up (Pt is doing well, would like to have a handheld nebulizer,)  HPI Marcia Stevenson is a 66 year old female who presents to the clinic for follow-up visit.  She was last seen in this clinic on 04/19/2021 by Thermon Leyland, FNP, for evaluation of asthma, allergic rhinitis, allergic conjunctivitis, and reflux.  At today's visit, she reports her asthma has not been well controlled over the last 2 months with an increase in symptoms over the last week.  She reports symptoms including wheezing in the daytime and nighttime and cough in the daytime and nighttime which is reported as dry and producing mucus.  She continues montelukast 10 mg once a day, Symbicort 160-2 puffs twice a day, and has been using albuterol 2-3 times a day over the last week.  She has previously used Spiriva with asthma flare with relief of symptoms.  She does report that she has experienced posttussive vomiting 2 times over the last week.  She denies fever, sweats, chills, and sick contacts.  Chart review indicates that on 03/14/2021 she had an absolute eosinophil count of 200.  Allergic rhinitis is reported as well controlled with no symptoms.  She continues Flonase as needed and is not currently taking an antihistamine or using a nasal saline rinse.  Allergic conjunctivitis is reported as not well controlled with symptoms including red and itchy eyes.  She has previously used cromolyn eyedrops, however, is out of these at this time.  Reflux is reported as not well controlled with symptoms of heartburn occurring several days a week.  She is not currently taking omeprazole, however, she has taken this medication in the past with relief of symptoms.  Her current medications are listed in the  chart.   Drug Allergies:  Allergies  Allergen Reactions   Zoster Vaccine Live Hives    Herpes zoster Herpes zoster     Physical Exam: BP 132/78   Pulse 94   Temp 98 F (36.7 C) (Temporal)   Resp 18   Wt 208 lb 6.4 oz (94.5 kg)   SpO2 98%   BMI 33.64 kg/m    Physical Exam Vitals reviewed.  Constitutional:      Appearance: Normal appearance.  HENT:     Head: Normocephalic and atraumatic.     Right Ear: Tympanic membrane normal.     Left Ear: Tympanic membrane normal.     Nose:     Comments: Bilateral naris edematous and pale with clear nasal drainage noted.  Pharynx slightly erythematous with no exudate.  Ears normal.  Eyes normal. Eyes:     Conjunctiva/sclera: Conjunctivae normal.  Cardiovascular:     Rate and Rhythm: Normal rate and regular rhythm.     Heart sounds: Normal heart sounds. No murmur heard. Pulmonary:     Effort: Pulmonary effort is normal.     Breath sounds: Normal breath sounds.     Comments: Lungs clear to auscultation Musculoskeletal:        General: Normal range of motion.     Cervical back: Normal range of motion and neck supple.  Skin:    General: Skin is warm and dry.  Neurological:     Mental Status: She is alert and oriented to person, place, and time.  Psychiatric:  Mood and Affect: Mood normal.        Behavior: Behavior normal.        Thought Content: Thought content normal.        Judgment: Judgment normal.     Diagnostics: FVC 2.50, FEV1 1.72.  Predicted FVC 2.71, predicted FEV1 2.12.  Spirometry indicates normal ventilatory function.  Assessment and Plan: 1. Moderate persistent asthma with acute exacerbation   2. Seasonal allergic rhinitis due to pollen   3. Seasonal allergic conjunctivitis   4. Gastroesophageal reflux disease, unspecified whether esophagitis present     Meds ordered this encounter  Medications   Budeson-Glycopyrrol-Formoterol (BREZTRI AEROSPHERE) 160-9-4.8 MCG/ACT AERO    Sig: Inhale 2 puffs into  the lungs in the morning and at bedtime.    Dispense:  1 each    Refill:  3    Patient Instructions  Asthma Begin prednisone 10 mg tablets. Take 2 tablets once a day for 4 days, then take 1 tablet on the 5th day, then stop.   Continue montelukast 10 mg once a day to prevent cough or wheeze.  Begin Breztri 2 puffs twice a day with a spacer to prevent cough and wheeze. This will replace Symbicort Continue albuterol 2 puffs every 4 hours as needed for cough or wheeze or instead, albuterol 0.083% 1 unit vial via nebulizer once every 4 hours as needed for cough or wheeze Your last lab work indicates that you do qualify for a biologic medication to control your asthma. Handout given.   Allergic rhinitis Continue an antihistamine one a day as needed for a runny nose or itch. Remember to rotate to a different antihistamine about every 3 months. Some examples of over the counter antihistamines include Zyrtec (cetirizine), Xyzal (levocetirizine), Allegra (fexofenadine), and Claritin (loratidine).  Continue Flonase 2 sprays in each nostril once a day as needed for stuffy nose.  In the right nostril, point the applicator out toward the right ear. In the left nostril, point the applicator out toward the left ear Consider saline nasal rinses as needed for nasal symptoms. Use this before any medicated nasal sprays for best result Continue allergen avoidance measures directed toward pollen as listed below  Allergic conjunctivitis Continue cromolyn eye drops 1-2 drops in each eye up to 4 times a day as needed for red or itchy eyes  Reflux Continue dietary and lifestyle modifications as listed below Begin omeprazole 20 mg once a day to control reflux. Take this medication 30 minutes before your first meal for Call the clinic if this treatment plan is not working well for you  Follow up in 2 months or sooner if needed.  Reducing Pollen Exposure The American Academy of Allergy, Asthma and Immunology  suggests the following steps to reduce your exposure to pollen during allergy seasons. Do not hang sheets or clothing out to dry; pollen may collect on these items. Do not mow lawns or spend time around freshly cut grass; mowing stirs up pollen. Keep windows closed at night.  Keep car windows closed while driving. Minimize morning activities outdoors, a time when pollen counts are usually at their highest. Stay indoors as much as possible when pollen counts or humidity is high and on windy days when pollen tends to remain in the air longer. Use air conditioning when possible.  Many air conditioners have filters that trap the pollen spores. Use a HEPA room air filter to remove pollen form the indoor air you breathe.   Lifestyle Changes for Controlling GERD When  you have GERD, stomach acid feels as if it's backing up toward your mouth. Whether or not you take medication to control your GERD, your symptoms can often be improved with lifestyle changes.   Raise Your Head Reflux is more likely to strike when you're lying down flat, because stomach fluid can flow backward more easily. Raising the head of your bed 4-6 inches can help. To do this: Slide blocks or books under the legs at the head of your bed. Or, place a wedge under the mattress. Many foam stores can make a suitable wedge for you. The wedge should run from your waist to the top of your head. Don't just prop your head on several pillows. This increases pressure on your stomach. It can make GERD worse.  Watch Your Eating Habits Certain foods may increase the acid in your stomach or relax the lower esophageal sphincter, making GERD more likely. It's best to avoid the following: Coffee, tea, and carbonated drinks (with and without caffeine) Fatty, fried, or spicy food Mint, chocolate, onions, and tomatoes Any other foods that seem to irritate your stomach or cause you pain  Relieve the Pressure Eat smaller meals, even if you have  to eat more often. Don't lie down right after you eat. Wait a few hours for your stomach to empty. Avoid tight belts and tight-fitting clothes. Lose excess weight.  Tobacco and Alcohol Avoid smoking tobacco and drinking alcohol. They can make GERD symptoms worse.  No follow-ups on file.    Thank you for the opportunity to care for this patient.  Please do not hesitate to contact me with questions.  Thermon Leyland, FNP [facility]

## 2021-08-25 ENCOUNTER — Encounter: Payer: Self-pay | Admitting: Family Medicine

## 2021-08-28 NOTE — Telephone Encounter (Signed)
Please have this patient send in a model number that she knows her insurance will cover. Thank you

## 2021-09-13 ENCOUNTER — Other Ambulatory Visit: Payer: Self-pay | Admitting: Family Medicine

## 2021-09-14 ENCOUNTER — Encounter: Payer: Self-pay | Admitting: Family Medicine

## 2021-09-15 NOTE — Telephone Encounter (Signed)
Can you please send in Symbicort 160-2 puffs twice a day and cetirizine 10 mg once a day please. Have her follow up as previously noted please. Thank you  Patient reports that Markus Daft is too expensive and is wanting to restart Symbicort. She reports her breathing has been well controlled. She also reports that she is interested in having an antihistamine that is covered by her insurance.

## 2021-09-18 ENCOUNTER — Other Ambulatory Visit: Payer: Self-pay

## 2021-09-18 MED ORDER — BUDESONIDE-FORMOTEROL FUMARATE 160-4.5 MCG/ACT IN AERO: 2.0000 | INHALATION_SPRAY | Freq: Two times a day (BID) | RESPIRATORY_TRACT | 5 refills | Status: DC

## 2021-09-18 MED ORDER — CETIRIZINE HCL 10 MG PO TABS: 10.0000 mg | ORAL_TABLET | Freq: Every day | ORAL | 5 refills | Status: DC

## 2021-10-30 NOTE — Progress Notes (Signed)
[address] [place] [address] Dept: [phone]  FOLLOW UP NOTE  Patient ID: Marcia Stevenson, female    DOB: [birth year]  Age: 66 y.o. MRN: [id number] Date of Office Visit: 10/31/2021  Assessment  Chief Complaint: Follow-up and Cough  HPI Marcia Stevenson is a 66 year old female who presents to the clinic for follow-up visit.  She was last seen in this clinic on 08/23/2021 by Thermon Leyland, FNP, for asthma requiring prednisone for relief of symptoms, allergic rhinitis, allergic conjunctivitis, and reflux.  At today's visit, she reports her asthma has been well controlled with no shortness of breath, cough, or wheeze with activity or rest.  She continues Symbicort 160-2 puffs twice a day with a spacer, montelukast several days of the week, and used albuterol about once a week over the last month.  She did try Breztri and reports that this medication was too costly to continue.  She is pleased with the results of Symbicort and agrees to use Spiriva as an add-on therapy for asthma flares if needed.  Of note, chart review indicates absolute eosinophil count of 200 on 03/14/2021.  She is not interested in pursuing a biologic therapy if to control asthma symptoms at this time.  Allergic rhinitis is reported as well controlled with no symptoms including rhinorrhea, nasal congestion, sneezing, or postnasal drainage.  She continues Flonase as needed and is not currently taking an antihistamine or using a nasal saline rinse.  Allergic conjunctivitis is reported as moderately well controlled with itch as the main symptom for which she uses cromolyn eyedrops with relief of symptoms.  Reflux is reported as well controlled with no symptoms including heartburn or vomiting.  She is currently not taking omeprazole, however, has taken this medication in the past.     Drug Allergies:  Allergies  Allergen Reactions   Zoster Vaccine Live Hives    Herpes zoster Herpes zoster     Physical Exam: BP 122/78  (BP Location: Left Arm, Patient Position: Sitting, Cuff Size: Normal)   Pulse 95   Temp 97.8 F (36.6 C) (Temporal)   Resp 18   Ht 5\' 6"  (1.676 m)   Wt 207 lb 1.8 oz (93.9 kg)   SpO2 99%   BMI 33.43 kg/m    Physical Exam Vitals reviewed.  Constitutional:      Appearance: Normal appearance.  HENT:     Head: Normocephalic and atraumatic.     Right Ear: Tympanic membrane normal.     Left Ear: Tympanic membrane normal.     Nose:     Comments: Bilateral nares edematous and pale with clear nasal drainage noted.  Pharynx normal.  Ears normal.  Eyes normal.    Mouth/Throat:     Pharynx: Oropharynx is clear.  Eyes:     Conjunctiva/sclera: Conjunctivae normal.  Cardiovascular:     Rate and Rhythm: Normal rate and regular rhythm.     Heart sounds: Normal heart sounds. No murmur heard. Pulmonary:     Effort: Pulmonary effort is normal.     Breath sounds: Normal breath sounds.     Comments: Lungs clear to auscultation Musculoskeletal:        General: Normal range of motion.     Cervical back: Normal range of motion and neck supple.  Skin:    General: Skin is warm and dry.  Neurological:     Mental Status: She is alert and oriented to person, place, and time.  Psychiatric:  Mood and Affect: Mood normal.        Behavior: Behavior normal.        Thought Content: Thought content normal.        Judgment: Judgment normal.     Diagnostics: FVC 2.34, FEV1 1.75.  Predicted FVC 2.71, predicted FEV1 2.12.  Spirometry indicates normal ventilatory function.  Assessment and Plan: 1. Moderate persistent asthma without complication   2. Seasonal allergic conjunctivitis   3. Seasonal allergic rhinitis due to pollen   4. Gastroesophageal reflux disease, unspecified whether esophagitis present     Meds ordered this encounter  Medications   Nebulizers (PORTABLE COMPRESSOR NEBULIZER) MISC    Sig: Dispence portable nebulizer and tubing and mask or mouth piece    Dispense:  1 each     Refill:  1    Patient Instructions  Asthma Increase montelukast 10 mg to once a day to prevent cough or wheeze Continue Symbicort 160-2 puffs twice a day with a spacer to prevent cough or wheeze Continue albuterol 2 puffs every 4 hours as needed for cough or wheeze or instead, albuterol 0.083% 1 unit vial via nebulizer once every 4 hours as needed for cough or wheeze For asthma flare, begin Spiriva 2.5 mcg 1 puff once a day for 2 weeks or until cough and wheeze free Your last lab work indicates that you do qualify for a biologic medication to control your asthma. Call the clinic if you are interested in this therapy  Allergic rhinitis Continue an antihistamine one a day as needed for a runny nose or itch. Remember to rotate to a different antihistamine about every 3 months. Some examples of over the counter antihistamines include Zyrtec (cetirizine), Xyzal (levocetirizine), Allegra (fexofenadine), and Claritin (loratidine).  Continue Flonase 2 sprays in each nostril once a day as needed for stuffy nose.  In the right nostril, point the applicator out toward the right ear. In the left nostril, point the applicator out toward the left ear Consider saline nasal rinses as needed for nasal symptoms. Use this before any medicated nasal sprays for best result Continue allergen avoidance measures directed toward pollen as listed below  Allergic conjunctivitis Continue cromolyn eye drops 1-2 drops in each eye up to 4 times a day as needed for red or itchy eyes  Reflux Continue dietary and lifestyle modifications as listed below  Call the clinic if this treatment plan is not working well for you  Follow up in 6 months or sooner if needed.   Return in about 6 months (around 05/02/2022), or if symptoms worsen or fail to improve.    Thank you for the opportunity to care for this patient.  Please do not hesitate to contact me with questions.  Gareth Morgan, FNP [facility]

## 2021-10-30 NOTE — Patient Instructions (Addendum)
Asthma Increase montelukast 10 mg to once a day to prevent cough or wheeze Continue Symbicort 160-2 puffs twice a day with a spacer to prevent cough or wheeze Continue albuterol 2 puffs every 4 hours as needed for cough or wheeze or instead, albuterol 0.083% 1 unit vial via nebulizer once every 4 hours as needed for cough or wheeze For asthma flare, begin Spiriva 2.5 mcg 1 puff once a day for 2 weeks or until cough and wheeze free Your last lab work indicates that you do qualify for a biologic medication to control your asthma. Call the clinic if you are interested in this therapy  Allergic rhinitis Continue an antihistamine one a day as needed for a runny nose or itch. Remember to rotate to a different antihistamine about every 3 months. Some examples of over the counter antihistamines include Zyrtec (cetirizine), Xyzal (levocetirizine), Allegra (fexofenadine), and Claritin (loratidine).  Continue Flonase 2 sprays in each nostril once a day as needed for stuffy nose.  In the right nostril, point the applicator out toward the right ear. In the left nostril, point the applicator out toward the left ear Consider saline nasal rinses as needed for nasal symptoms. Use this before any medicated nasal sprays for best result Continue allergen avoidance measures directed toward pollen as listed below  Allergic conjunctivitis Continue cromolyn eye drops 1-2 drops in each eye up to 4 times a day as needed for red or itchy eyes  Reflux Continue dietary and lifestyle modifications as listed below  Call the clinic if this treatment plan is not working well for you  Follow up in 6 months or sooner if needed.  Reducing Pollen Exposure The American Academy of Allergy[employer] Immun[employer] suggests the following steps to reduce your exposure to pollen during allergy seasons. Do not hang sheets or clothing out to dry; pollen may collect on these items. Do not mow lawns or spend time around freshly cut grass;  mowing stirs up pollen. Keep windows closed at night.  Keep car windows closed while driving. Minimize morning activities outdoors, a time when pollen counts are usually at their highest. Stay indoors as much as possible when pollen counts or humidity is high and on windy days when pollen tends to remain in the air longer. Use air conditioning when possible.  Many air conditioners have filters that trap the pollen spores. Use a HEPA room air filter to remove pollen form the indoor air you breathe.   Lifestyle Changes for Controlling GERD When you have GERD, stomach acid feels as if it's backing up toward your mouth. Whether or not you take medication to control your GERD, your symptoms can often be improved with lifestyle changes.   Raise Your Head Reflux is more likely to strike when you're lying down flat, because stomach fluid can flow backward more easily. Raising the head of your bed 4-6 inches can help. To do this: Slide blocks or books under the legs at the head of your bed. Or, place a wedge under the mattress. Many foam stores can make a suitable wedge for you. The wedge should run from your waist to the top of your head. Don't just prop your head on several pillows. This increases pressure on your stomach. It can make GERD worse.  Watch Your Eating Habits Certain foods may increase the acid in your stomach or relax the lower esophageal sphincter, making GERD more likely. It's best to avoid the following: Coffee, tea, and carbonated drinks (with and without  caffeine) Fatty, fried, or spicy food Mint, chocolate, onions, and tomatoes Any other foods that seem to irritate your stomach or cause you pain  Relieve the Pressure Eat smaller meals, even if you have to eat more often. Don't lie down right after you eat. Wait a few hours for your stomach to empty. Avoid tight belts and tight-fitting clothes. Lose excess weight.  Tobacco and Alcohol Avoid smoking tobacco and  drinking alcohol. They can make GERD symptoms worse.

## 2021-10-31 ENCOUNTER — Encounter: Payer: Self-pay | Admitting: Family Medicine

## 2021-10-31 ENCOUNTER — Ambulatory Visit (INDEPENDENT_AMBULATORY_CARE_PROVIDER_SITE_OTHER): Payer: Medicare Other | Admitting: Family Medicine

## 2021-10-31 VITALS — BP 122/78 | HR 95 | Temp 97.8°F | Resp 18 | Ht 66.0 in | Wt 207.1 lb

## 2021-10-31 DIAGNOSIS — K219 Gastro-esophageal reflux disease without esophagitis: Secondary | ICD-10-CM

## 2021-10-31 DIAGNOSIS — J4540 Moderate persistent asthma, uncomplicated: Secondary | ICD-10-CM

## 2021-10-31 DIAGNOSIS — H1013 Acute atopic conjunctivitis, bilateral: Secondary | ICD-10-CM | POA: Diagnosis not present

## 2021-10-31 DIAGNOSIS — J301 Allergic rhinitis due to pollen: Secondary | ICD-10-CM | POA: Diagnosis not present

## 2021-10-31 DIAGNOSIS — H1010 Acute atopic conjunctivitis, unspecified eye: Secondary | ICD-10-CM

## 2021-10-31 MED ORDER — PORTABLE COMPRESSOR NEBULIZER MISC
1 refills | Status: AC
Start: 2021-10-31 — End: ?

## 2022-02-27 ENCOUNTER — Ambulatory Visit: Payer: Medicare Other | Admitting: Family Medicine

## 2022-04-16 NOTE — Patient Instructions (Incomplete)
Asthma Increase montelukast 10 mg to once a day to prevent cough or wheeze Continue Symbicort 160-2 puffs twice a day with a spacer to prevent cough or wheeze Continue albuterol 2 puffs every 4 hours as needed for cough or wheeze or instead, albuterol 0.083% 1 unit vial via nebulizer once every 4 hours as needed for cough or wheeze For asthma flare, begin Spiriva 2.5 mcg 1 puff once a day for 2 weeks or until cough and wheeze free Your last lab work indicates that you do qualify for a biologic medication to control your asthma. Call the clinic if you are interested in this therapy  Allergic rhinitis Continue an antihistamine one a day as needed for a runny nose or itch. Remember to rotate to a different antihistamine about every 3 months. Some examples of over the counter antihistamines include Zyrtec (cetirizine), Xyzal (levocetirizine), Allegra (fexofenadine), and Claritin (loratidine).  Continue Flonase 2 sprays in each nostril once a day as needed for stuffy nose.  In the right nostril, point the applicator out toward the right ear. In the left nostril, point the applicator out toward the left ear Consider saline nasal rinses as needed for nasal symptoms. Use this before any medicated nasal sprays for best result Continue allergen avoidance measures directed toward pollen as listed below  Allergic conjunctivitis Continue cromolyn eye drops 1-2 drops in each eye up to 4 times a day as needed for red or itchy eyes  Reflux Continue dietary and lifestyle modifications as listed below  Call the clinic if this treatment plan is not working well for you  Follow up in 6 months or sooner if needed.  Reducing Pollen Exposure The American Academy of Allergy, Asthma and Immunology suggests the following steps to reduce your exposure to pollen during allergy seasons. Do not hang sheets or clothing out to dry; pollen may collect on these items. Do not mow lawns or spend time around freshly cut grass;  mowing stirs up pollen. Keep windows closed at night.  Keep car windows closed while driving. Minimize morning activities outdoors, a time when pollen counts are usually at their highest. Stay indoors as much as possible when pollen counts or humidity is high and on windy days when pollen tends to remain in the air longer. Use air conditioning when possible.  Many air conditioners have filters that trap the pollen spores. Use a HEPA room air filter to remove pollen form the indoor air you breathe.   Lifestyle Changes for Controlling GERD When you have GERD, stomach acid feels as if it's backing up toward your mouth. Whether or not you take medication to control your GERD, your symptoms can often be improved with lifestyle changes.   Raise Your Head Reflux is more likely to strike when you're lying down flat, because stomach fluid can flow backward more easily. Raising the head of your bed 4-6 inches can help. To do this: Slide blocks or books under the legs at the head of your bed. Or, place a wedge under the mattress. Many foam stores can make a suitable wedge for you. The wedge should run from your waist to the top of your head. Don't just prop your head on several pillows. This increases pressure on your stomach. It can make GERD worse.  Watch Your Eating Habits Certain foods may increase the acid in your stomach or relax the lower esophageal sphincter, making GERD more likely. It's best to avoid the following: Coffee, tea, and carbonated drinks (with and without  caffeine) Fatty, fried, or spicy food Mint, chocolate, onions, and tomatoes Any other foods that seem to irritate your stomach or cause you pain  Relieve the Pressure Eat smaller meals, even if you have to eat more often. Don't lie down right after you eat. Wait a few hours for your stomach to empty. Avoid tight belts and tight-fitting clothes. Lose excess weight.  Tobacco and Alcohol Avoid smoking tobacco and  drinking alcohol. They can make GERD symptoms worse.

## 2022-04-16 NOTE — Progress Notes (Unsigned)
[address] [place] [address] Dept: [phone]  FOLLOW UP NOTE  Patient ID: Marcia Stevenson, female    DOB: Jun 26, 1955  Age: 66 y.o. MRN: [id number] Date of Office Visit: 04/17/2022  Assessment  Chief Complaint: No chief complaint on file.  HPI Marcia Stevenson is a 67 year old female who presents to the clinic for a follow up visit. She was last seen in this clinic on 10/31/2021 by Thermon Leyland, FNP, for evaluation of asthma, allergic rhinitis, allergic conjunctivitis, and reflux.   At today's visit, she reports her asthma has been well-controlled with no shortness of breath, cough, or wheeze with activity or rest.  She continues montelukast 10 mg once a day, Symbicort 160-2 puffs in the morning, and has not used albuterol since her last visit to this clinic.  She has not needed to use Spiriva for asthma flare since her last visit to this clinic.   Allergic rhinitis is reported as moderately well-controlled with nasal congestion and sneezing as the main symptoms.  She is not currently taking an antihistamine, using a nasal steroid spray, or using saline nasal rinses.  Chart review indicates her last environmental allergy testing was greater than 5 years ago and was positive to pollens.  Allergic conjunctivitis is reported as poorly controlled with symptoms including red and itchy eyes for which she is not currently using any medical intervention.  She has previously used cromolyn eyedrops with relief of symptoms, however, she is currently out of this medication.  She is not currently using a lubricating eyedrop.  Reflux is reported as well with no symptoms including vomiting.  She is not currently taking a medication to control reflux.  She has previously taken omeprazole.  Her current medications are listed in the chart.   Drug Allergies:  Allergies  Allergen Reactions   Zoster Vaccine Live Hives    Herpes zoster Herpes zoster     Physical Exam: BP 104/70 (BP  Location: Left Arm, Patient Position: Sitting, Cuff Size: Normal)   Pulse 93   Temp 98.4 F (36.9 C) (Temporal)   Resp 18   Wt 202 lb 3.2 oz (91.7 kg)   SpO2 100%   BMI 32.64 kg/m    Physical Exam Vitals reviewed.  Constitutional:      Appearance: Normal appearance.  HENT:     Head: Normocephalic and atraumatic.     Right Ear: Tympanic membrane normal.     Left Ear: Tympanic membrane normal.     Nose:     Comments: Bilateral naris edematous and pale with clear nasal drainage noted.  Pharynx normal.  Ears normal.  Eyes normal.    Mouth/Throat:     Pharynx: Oropharynx is clear.  Eyes:     Conjunctiva/sclera: Conjunctivae normal.  Cardiovascular:     Rate and Rhythm: Normal rate and regular rhythm.     Heart sounds: Normal heart sounds. No murmur heard. Pulmonary:     Effort: Pulmonary effort is normal.     Breath sounds: Normal breath sounds.     Comments: Lungs clear to auscultation Musculoskeletal:        General: Normal range of motion.     Cervical back: Normal range of motion and neck supple.  Skin:    General: Skin is warm and dry.  Neurological:     Mental Status: She is alert and oriented to person, place, and time.  Psychiatric:        Mood and Affect: Mood normal.  Behavior: Behavior normal.        Thought Content: Thought content normal.        Judgment: Judgment normal.     Diagnostics: FVC 2.48, FEV1 1.76.  Predicted FVC 2.69, predicted FEV1 2.11.  Spirometry indicates normal ventilatory function.  Assessment and Plan: 1. Moderate persistent asthma without complication   2. Seasonal allergic rhinitis due to pollen   3. Seasonal allergic conjunctivitis   4. Gastroesophageal reflux disease, unspecified whether esophagitis present     Meds ordered this encounter  Medications   albuterol (VENTOLIN HFA) 108 (90 Base) MCG/ACT inhaler    Sig: Inhale 2 puffs into the lungs every 4 (four) hours as needed for wheezing or shortness of breath.     Dispense:  3 each    Refill:  1    Dispense 90 day supply.   cromolyn (OPTICROM) 4 % ophthalmic solution    Sig: 1-2 drops in each eye four times daily as needed for itchy eyes    Dispense:  30 mL    Refill:  1    Please dispense 90 day supply   montelukast (SINGULAIR) 10 MG tablet    Sig: Take 1 tablet (10 mg total) by mouth at bedtime.    Dispense:  90 tablet    Refill:  1   cetirizine (ZYRTEC) 10 MG tablet    Sig: Take 1 tablet (10 mg total) by mouth daily.    Dispense:  30 tablet    Refill:  5    Patient Instructions  Asthma Continue montelukast 10 mg to once a day to prevent cough or wheeze Continue Symbicort 160-2 puffs once a day with a spacer to prevent cough or wheeze Continue albuterol 2 puffs every 4 hours as needed for cough or wheeze or instead, albuterol 0.083% 1 unit vial via nebulizer once every 4 hours as needed for cough or wheeze For asthma flare, increase Symbicort 160 to 2 puffs twice a day for 2 weeks or until cough or wheeze free, then return to original dosing.  Allergic rhinitis Continue an antihistamine once a day as needed for a runny nose or itch. Remember to rotate to a different antihistamine about every 3 months. Some examples of over the counter antihistamines include Zyrtec (cetirizine), Xyzal (levocetirizine), Allegra (fexofenadine), and Claritin (loratidine).  Continue Flonase 2 sprays in each nostril once a day as needed for stuffy nose.  In the right nostril, point the applicator out toward the right ear. In the left nostril, point the applicator out toward the left ear Consider saline nasal rinses as needed for nasal symptoms. Use this before any medicated nasal sprays for best result Continue allergen avoidance measures directed toward pollen as listed below  Allergic conjunctivitis Continue cromolyn eye drops 1-2 drops in each eye up to 4 times a day as needed for red or itchy eyes Consider a lubricating eye drop as needed  Reflux Continue  dietary and lifestyle modifications as listed below  Call the clinic if this treatment plan is not working well for you  Follow up in 6 months or sooner if needed.   Return in about 6 months (around 10/17/2022), or if symptoms worsen or fail to improve.    Thank you for the opportunity to care for this patient.  Please do not hesitate to contact me with questions.  Thermon Leyland, FNP [facility]

## 2022-04-17 ENCOUNTER — Ambulatory Visit (INDEPENDENT_AMBULATORY_CARE_PROVIDER_SITE_OTHER): Payer: Medicare Other | Admitting: Family Medicine

## 2022-04-17 ENCOUNTER — Other Ambulatory Visit: Payer: Self-pay

## 2022-04-17 ENCOUNTER — Encounter: Payer: Self-pay | Admitting: Family Medicine

## 2022-04-17 VITALS — BP 104/70 | HR 93 | Temp 98.4°F | Resp 18 | Wt 202.2 lb

## 2022-04-17 DIAGNOSIS — K219 Gastro-esophageal reflux disease without esophagitis: Secondary | ICD-10-CM

## 2022-04-17 DIAGNOSIS — J4540 Moderate persistent asthma, uncomplicated: Secondary | ICD-10-CM | POA: Diagnosis not present

## 2022-04-17 DIAGNOSIS — J301 Allergic rhinitis due to pollen: Secondary | ICD-10-CM | POA: Diagnosis not present

## 2022-04-17 DIAGNOSIS — H1010 Acute atopic conjunctivitis, unspecified eye: Secondary | ICD-10-CM

## 2022-04-17 DIAGNOSIS — H1013 Acute atopic conjunctivitis, bilateral: Secondary | ICD-10-CM | POA: Diagnosis not present

## 2022-04-17 MED ORDER — MONTELUKAST SODIUM 10 MG PO TABS: 10.0000 mg | ORAL_TABLET | Freq: Every day | ORAL | 1 refills | Status: DC

## 2022-04-17 MED ORDER — ALBUTEROL SULFATE HFA 108 (90 BASE) MCG/ACT IN AERS: 2.0000 | INHALATION_SPRAY | RESPIRATORY_TRACT | 1 refills | Status: DC | PRN

## 2022-04-17 MED ORDER — CROMOLYN SODIUM 4 % OP SOLN: OPHTHALMIC | 1 refills | Status: DC

## 2022-04-17 MED ORDER — CETIRIZINE HCL 10 MG PO TABS: 10.0000 mg | ORAL_TABLET | Freq: Every day | ORAL | 5 refills | Status: AC

## 2022-04-20 ENCOUNTER — Other Ambulatory Visit: Payer: Self-pay

## 2022-04-20 MED ORDER — CROMOLYN SODIUM 4 % OP SOLN: OPHTHALMIC | 1 refills | Status: DC

## 2022-04-23 ENCOUNTER — Other Ambulatory Visit: Payer: Self-pay

## 2022-04-23 MED ORDER — CROMOLYN SODIUM 4 % OP SOLN: OPHTHALMIC | 1 refills | Status: DC

## 2022-09-13 ENCOUNTER — Encounter (HOSPITAL_BASED_OUTPATIENT_CLINIC_OR_DEPARTMENT_OTHER): Payer: Self-pay | Admitting: Emergency Medicine

## 2022-09-13 ENCOUNTER — Emergency Department (HOSPITAL_BASED_OUTPATIENT_CLINIC_OR_DEPARTMENT_OTHER)
Admission: EM | Admit: 2022-09-13 | Discharge: 2022-09-13 | Disposition: A | Discharge status: Home / Self Care | Payer: Medicare Other | Attending: Emergency Medicine | Admitting: Emergency Medicine

## 2022-09-13 ENCOUNTER — Other Ambulatory Visit: Payer: Self-pay

## 2022-09-13 DIAGNOSIS — Z79899 Other long term (current) drug therapy: Secondary | ICD-10-CM | POA: Diagnosis not present

## 2022-09-13 DIAGNOSIS — Z7982 Long term (current) use of aspirin: Secondary | ICD-10-CM | POA: Diagnosis not present

## 2022-09-13 DIAGNOSIS — I10 Essential (primary) hypertension: Secondary | ICD-10-CM | POA: Insufficient documentation

## 2022-09-13 DIAGNOSIS — M79661 Pain in right lower leg: Secondary | ICD-10-CM | POA: Insufficient documentation

## 2022-09-13 DIAGNOSIS — M5416 Radiculopathy, lumbar region: Secondary | ICD-10-CM | POA: Insufficient documentation

## 2022-09-13 DIAGNOSIS — M79662 Pain in left lower leg: Secondary | ICD-10-CM | POA: Insufficient documentation

## 2022-09-13 NOTE — Discharge Instructions (Signed)
Begin taking your meloxicam in addition to the Tylenol once again.  Continue your walking and other lumbar spinal rehab exercises which you have completed in the past, and follow-up closely (return to the ED) if you have significant unilateral leg swelling, begin develop chest pain, shortness of breath.  You can reach out to your orthopedic doctor if your pain persist despite treatment.

## 2022-09-13 NOTE — ED Provider Notes (Signed)
[place] [facility] Provider Note   CSN: [id number] Arrival date & time: 09/13/22  5188     History  Chief Complaint  Patient presents with   Leg Pain    Aahana Moses is a 67 y.o. female past medical history significant for hypertension, lumbar radiculopathy since concern for bilateral leg pain that radiates downward into calf and posterior leg started on around 4 days ago, worsened Tuesday night.  She denies any new injury, but does endorse that she has been frequently walking.  She had an epidural injection in July, and reports she normally has relief for several months but that it is only been 2 months.   Leg Pain      Home Medications Prior to Admission medications   Medication Sig Start Date End Date Taking? Authorizing Provider  albuterol (PROVENTIL) (2.5 MG/3ML) 0.083% nebulizer solution Take 3 mLs (2.5 mg total) by nebulization every 4 (four) hours as needed for wheezing or shortness of breath. 06/10/19   Hetty Blend, FNP  albuterol (VENTOLIN HFA) 108 (90 Base) MCG/ACT inhaler Inhale 2 puffs into the lungs every 4 (four) hours as needed for wheezing or shortness of breath. 04/17/22   Hetty Blend, FNP  aspirin 81 MG EC tablet Take by mouth.    [provider]  atorvastatin (LIPITOR) 10 MG tablet Take 1 tablet by mouth once daily 12/12/18   [provider]  Budeson-Glycopyrrol-Formoterol (BREZTRI AEROSPHERE) 160-9-4.8 MCG/ACT AERO Inhale 2 puffs into the lungs in the morning and at bedtime. 08/23/21   Hetty Blend, FNP  budesonide-formoterol (SYMBICORT) 160-4.5 MCG/ACT inhaler Inhale 2 puffs into the lungs 2 (two) times daily. 09/18/21   Hetty Blend, FNP  cetirizine (ZYRTEC) 10 MG tablet Take 1 tablet (10 mg total) by mouth daily. 04/17/22   Hetty Blend, FNP  chlorhexidine (PERIDEX) 0.12 % solution 15 mLs 2 (two) times daily. 03/16/20   [provider]  cromolyn (OPTICROM) 4 % ophthalmic solution 1-2 drops in each  eye four times daily as needed for itchy eyes 04/23/22   Ambs, Norvel Richards, FNP  diclofenac (VOLTAREN) 50 MG EC tablet Take by mouth.    [provider]  diclofenac Sodium (VOLTAREN) 1 % GEL Apply topically 4 (four) times daily.    [provider]  fluticasone (FLONASE) 50 MCG/ACT nasal spray 2 sprays per nostril once a day if needed for stuffy nose 06/17/18   Fletcher Anon, MD  gabapentin (NEURONTIN) 300 MG capsule Take by mouth. 04/28/20   [provider]  hydrOXYzine (ATARAX/VISTARIL) 10 MG tablet Take 1 tablet 2 hours before bedtime. 03/05/19   [provider]  ibuprofen (ADVIL,MOTRIN) 800 MG tablet Take 800 mg by mouth. 03/26/16   [provider]  lisinopril (PRINIVIL,ZESTRIL) 10 MG tablet TAKE ONE TABLET BY MOUTH ONCE DAILY 05/25/14   Bradd Canary, MD  montelukast (SINGULAIR) 10 MG tablet Take 1 tablet (10 mg total) by mouth at bedtime. 04/17/22   Hetty Blend, FNP  Nebulizers (PORTABLE COMPRESSOR NEBULIZER) MISC Dispence portable nebulizer and tubing and mask or mouth piece 10/31/21   Ambs, Norvel Richards, FNP  nortriptyline (PAMELOR) 10 MG capsule Take by mouth. 11/11/18   [provider]  Omega-3 Fatty Acids (FISH OIL PO) Take by mouth.    [provider]  omeprazole (PRILOSEC) 20 MG capsule Take 1 capsule (20 mg total) by mouth daily. 08/01/20   Hetty Blend, FNP  Respiratory Therapy Supplies (ADULT  MASK) DEVI Nebulizer mask for adult 04/19/21   Hetty Blend, FNP  traMADol Janean Sark) 50 MG tablet SMARTSIG:1 Tablet(s) By Mouth Every 0-6 Hours PRN 03/12/20   [provider]  triamcinolone ointment (KENALOG) 0.1 % Apply 2 times a day to itchy areas on the body. Never to the face. 03/05/19   [provider]  valACYclovir (VALTREX) 500 MG tablet  11/22/16   [provider]  zonisamide (ZONEGRAN) 25 MG capsule Take by mouth. 04/19/20   [provider]      Allergies    Zoster vaccine live    Review of Systems    Review of Systems  All other systems reviewed and are negative.   Physical Exam Updated Vital Signs BP (!) 161/91 (BP Location: Right Arm)   Pulse 78   Temp 98.3 F (36.8 C) (Oral)   Resp 18   SpO2 98%  Physical Exam Vitals and nursing note reviewed.  Constitutional:      General: She is not in acute distress.    Appearance: Normal appearance.  HENT:     Head: Normocephalic and atraumatic.  Eyes:     General:        Right eye: No discharge.        Left eye: No discharge.  Cardiovascular:     Rate and Rhythm: Normal rate and regular rhythm.  Pulmonary:     Effort: Pulmonary effort is normal. No respiratory distress.  Musculoskeletal:        General: No deformity.     Comments: Some shooting pain down bilateral legs, left greater than right with straight leg raise.  She has symmetric calves bilaterally, with normal appearance of overlying skin  Skin:    General: Skin is warm and dry.     Capillary Refill: Capillary refill takes less than 2 seconds.  Neurological:     Mental Status: She is alert and oriented to person, place, and time.  Psychiatric:        Mood and Affect: Mood normal.        Behavior: Behavior normal.     ED Results / Procedures / Treatments   Labs (all labs ordered are listed, but only abnormal results are displayed) Labs Reviewed - No data to display  EKG None  Radiology No results found.  Procedures Procedures    Medications Ordered in ED Medications - No data to display  ED Course/ Medical Decision Making/ A&P                                 Medical Decision Making  This patient is a 67 y.o. female who presents to the ED for concern of bilateral calf pain.   Differential diagnoses prior to evaluation: DVT, peripheral edema, Achilles tendon rupture, calf muscle strain, calf muscle rupture, cellulitis, abscess, PAD, PVD, versus other musculoskeletal injury, most suspicious of radiating pain from lumbar radiculopathy previously  documented  Past Medical History / Social History / Additional history: Chart reviewed. Pertinent results include: lumbar radiculopathy, extensively reviewed recent orthoepdic evaluation including epidural in July of this year for same  Physical Exam: Physical exam performed. The pertinent findings include: some positive straight leg raise bilaterally, no evidence of unilateral calf swelling.  No tachycardia, no fever, she is somewhat hypertensive, blood pressure 161/91.  Medications / Treatment: Encouraged continued Tylenol, meloxicam, rehab exercises, and close orthopedic follow-up   Disposition: After consideration of the diagnostic results  and the patients response to treatment, I feel that patient with overall low clinical risk for developing spontaneous bilateral DVT, no previous history of clotting disorder, no recent travel, no recent surgery, no previous history of cancer, no hormonal birth control use.  She is not having any chest pain, shortness of breath.  Given her previous history of lumbar radiculopathy, shooting pain down the back of calves with straight leg raise discussed that I have low clinical suspicion that patient is having new bilateral DVT, suspect complications related to her lumbar radiculopathy.  Encouraged Tylenol, meloxicam, and orthopedic follow-up.  Discussed return precautions for unilateral leg swelling, shortness of breath, chest pain, tachycardia, patient understands and agrees to plan.   emergency department workup does not suggest an emergent condition requiring admission or immediate intervention beyond what has been performed at this time. The plan is: as above. The patient is safe for discharge and has been instructed to return immediately for worsening symptoms, change in symptoms or any other concerns.  Final Clinical Impression(s) / ED Diagnoses Final diagnoses:  Lumbar radiculopathy  Bilateral calf pain    Rx / DC Orders ED Discharge Orders      None         Olene Floss, PA-C 09/13/22 1034    Pricilla Loveless, MD 09/13/22 1154

## 2022-09-13 NOTE — ED Triage Notes (Signed)
Pt reports bilateral leg pain that radiates downward that worsened tuesday night. Reports pain is worse in the evening.   Hx of herniated disc in L4 and L5.

## 2022-10-09 ENCOUNTER — Other Ambulatory Visit: Payer: Self-pay | Admitting: Family Medicine

## 2022-10-23 ENCOUNTER — Encounter: Payer: Self-pay | Admitting: Family Medicine

## 2022-10-23 ENCOUNTER — Ambulatory Visit: Payer: Medicare Other | Admitting: Family Medicine

## 2022-10-23 ENCOUNTER — Other Ambulatory Visit: Payer: Self-pay

## 2022-10-23 VITALS — BP 134/66 | HR 70 | Temp 97.9°F | Resp 22 | Ht 66.0 in | Wt 198.6 lb

## 2022-10-23 DIAGNOSIS — H1010 Acute atopic conjunctivitis, unspecified eye: Secondary | ICD-10-CM

## 2022-10-23 DIAGNOSIS — J4540 Moderate persistent asthma, uncomplicated: Secondary | ICD-10-CM | POA: Diagnosis not present

## 2022-10-23 DIAGNOSIS — J301 Allergic rhinitis due to pollen: Secondary | ICD-10-CM

## 2022-10-23 DIAGNOSIS — K219 Gastro-esophageal reflux disease without esophagitis: Secondary | ICD-10-CM

## 2022-10-23 NOTE — Progress Notes (Signed)
[address] [place] [address] Dept: [phone]  FOLLOW UP NOTE  Patient ID: Marcia Stevenson, female    DOB: Jun 02, 1955  Age: 67 y.o. MRN: [id number] Date of Office Visit: 10/23/2022  Assessment  Chief Complaint: Follow-up and Medication Refill (Refill symbicort, 6 month follow up, normal seasonal allergy's with itchy watery eyes)  HPI Marcia Stevenson, is a 67 year old female who presents to the clinic for follow-up visit.  She was last seen in this clinic on 04/17/2022 by Thermon Leyland, FNP, for evaluation of asthma, allergic rhinitis, allergic conjunctivitis, and reflux.    At today's visit, she reports that her asthma has been moderately well controlled with occasional dry cough with weather change. She denies shortness of breath and wheeze with activity and rest. She continues Symbicort 60-2 puffs twice a day and uses albuterol an average of twice a month with relief of symptoms. She continues montelukast only as needed.  Allergic rhinitis is reported as well controlled without symptoms at this time. She occasionally uses Flonase as needed and is not currently using an antihistamine or nasal saline rinse. Her last environmental allergy testing was greater than 6 years ago.  Allergic conjunctivitis is reported as moderately well controlled with occasional red and itchy eyes. She sometimes uses a lubricating eye drop and continues cromolyn several days a week.   Reflux is reported as well-controlled with no symptoms including heartburn or vomiting.  She is not currently taking medication to control reflux.  Her current medications are listed in the chart.  Of note, patient's respiratory rate was slightly elevated upon arrival.  Patient notes that she had been walking before her appointment today.  Physical exam normal and other vitals within normal limits. Patient walked around the clinic 2 times and vitals were recorded. Physical exam and vitals within normal limits.  Drug  Allergies:  Allergies  Allergen Reactions   Zoster Vaccine Live Hives    Herpes zoster Herpes zoster     Physical Exam: BP 134/66 (BP Location: Left Arm, Patient Position: Sitting, Cuff Size: Normal)   Pulse 70   Temp 97.9 F (36.6 C) (Temporal)   Resp (!) 22   Ht 5\' 6"  (1.676 m)   Wt 198 lb 9.6 oz (90.1 kg)   SpO2 98%   BMI 32.05 kg/m    Physical Exam Vitals reviewed.  Constitutional:      Appearance: Normal appearance.  HENT:     Head: Normocephalic and atraumatic.     Right Ear: Tympanic membrane normal.     Left Ear: Tympanic membrane normal.     Nose:     Comments: Bilateral nares slightly edematous and pale with thin clear nasal drainage noted.  Pharynx normal.  Ears normal.    Mouth/Throat:     Pharynx: Oropharynx is clear.  Eyes:     Conjunctiva/sclera: Conjunctivae normal.  Cardiovascular:     Rate and Rhythm: Normal rate and regular rhythm.     Heart sounds: Normal heart sounds. No murmur heard. Pulmonary:     Effort: Pulmonary effort is normal.     Breath sounds: Normal breath sounds.     Comments: Lungs clear to auscultation Musculoskeletal:        General: Normal range of motion.     Cervical back: Normal range of motion and neck supple.  Skin:    General: Skin is warm and dry.  Neurological:     Mental Status: She is alert and oriented to person, place, and time.  Psychiatric:        Mood and Affect: Mood normal.        Behavior: Behavior normal.        Thought Content: Thought content normal.        Judgment: Judgment normal.     Diagnostics: FVC 2.48 which is 93% of predicted value, FEV1 1.81 which is 87% of predicted values.  Spirometry indicates normal ventilatory function.  Assessment and Plan: 1. Moderate persistent asthma without complication   2. Seasonal allergic rhinitis due to pollen   3. Seasonal allergic conjunctivitis   4. Gastroesophageal reflux disease, unspecified whether esophagitis present     Patient Instructions   Asthma Continue montelukast 10 mg to once a day to prevent cough or wheeze Continue Symbicort 160-2 puffs once a day with a spacer to prevent cough or wheeze Continue albuterol 2 puffs every 4 hours as needed for cough or wheeze or instead, albuterol 0.083% 1 unit vial via nebulizer once every 4 hours as needed for cough or wheeze For asthma flare, increase Symbicort 160 to 2 puffs twice a day for 2 weeks or until cough or wheeze free, then return to original dosing.  Allergic rhinitis Continue an antihistamine once a day as needed for a runny nose or itch. Remember to rotate to a different antihistamine about every 3 months. Some examples of over the counter antihistamines include Zyrtec (cetirizine), Xyzal (levocetirizine), Allegra (fexofenadine), and Claritin (loratidine).  Continue Flonase 2 sprays in each nostril once a day as needed for stuffy nose.  In the right nostril, point the applicator out toward the right ear. In the left nostril, point the applicator out toward the left ear Consider saline nasal rinses as needed for nasal symptoms. Use this before any medicated nasal sprays for best result Continue allergen avoidance measures directed toward pollen as listed below  Allergic conjunctivitis Continue cromolyn eye drops 1-2 drops in each eye up to 4 times a day as needed for red or itchy eyes Consider a lubricating eye drop as needed  Reflux Continue dietary and lifestyle modifications as listed below  Call the clinic if this treatment plan is not working well for you  Follow up in 6 months or sooner if needed.   Return in about 6 months (around 04/23/2023), or if symptoms worsen or fail to improve.    Thank you for the opportunity to care for this patient.  Please do not hesitate to contact me with questions.  Thermon Leyland, FNP [facility]

## 2022-10-23 NOTE — Patient Instructions (Signed)
Asthma Increase montelukast 10 mg to once a day to prevent cough or wheeze Continue Symbicort 160-2 puffs twice a day with a spacer to prevent cough or wheeze Continue albuterol 2 puffs every 4 hours as needed for cough or wheeze or instead, albuterol 0.083% 1 unit vial via nebulizer once every 4 hours as needed for cough or wheeze For asthma flare, begin Spiriva 2.5 mcg 1 puff once a day for 2 weeks or until cough and wheeze free Your last lab work indicates that you do qualify for a biologic medication to control your asthma. Call the clinic if you are interested in this therapy  Allergic rhinitis Continue an antihistamine one a day as needed for a runny nose or itch. Remember to rotate to a different antihistamine about every 3 months. Some examples of over the counter antihistamines include Zyrtec (cetirizine), Xyzal (levocetirizine), Allegra (fexofenadine), and Claritin (loratidine).  Continue Flonase 2 sprays in each nostril once a day as needed for stuffy nose.  In the right nostril, point the applicator out toward the right ear. In the left nostril, point the applicator out toward the left ear Consider saline nasal rinses as needed for nasal symptoms. Use this before any medicated nasal sprays for best result Continue allergen avoidance measures directed toward pollen as listed below  Allergic conjunctivitis Continue cromolyn eye drops 1-2 drops in each eye up to 4 times a day as needed for red or itchy eyes  Reflux Continue dietary and lifestyle modifications as listed below  Call the clinic if this treatment plan is not working well for you  Follow up in 6 months or sooner if needed.  Reducing Pollen Exposure The American Academy of Allergy, Asthma and Immunology suggests the following steps to reduce your exposure to pollen during allergy seasons. Do not hang sheets or clothing out to dry; pollen may collect on these items. Do not mow lawns or spend time around freshly cut grass;  mowing stirs up pollen. Keep windows closed at night.  Keep car windows closed while driving. Minimize morning activities outdoors, a time when pollen counts are usually at their highest. Stay indoors as much as possible when pollen counts or humidity is high and on windy days when pollen tends to remain in the air longer. Use air conditioning when possible.  Many air conditioners have filters that trap the pollen spores. Use a HEPA room air filter to remove pollen form the indoor air you breathe.   Lifestyle Changes for Controlling GERD When you have GERD, stomach acid feels as if it's backing up toward your mouth. Whether or not you take medication to control your GERD, your symptoms can often be improved with lifestyle changes.   Raise Your Head Reflux is more likely to strike when you're lying down flat, because stomach fluid can flow backward more easily. Raising the head of your bed 4-6 inches can help. To do this: Slide blocks or books under the legs at the head of your bed. Or, place a wedge under the mattress. Many foam stores can make a suitable wedge for you. The wedge should run from your waist to the top of your head. Don't just prop your head on several pillows. This increases pressure on your stomach. It can make GERD worse.  Watch Your Eating Habits Certain foods may increase the acid in your stomach or relax the lower esophageal sphincter, making GERD more likely. It's best to avoid the following: Coffee, tea, and carbonated drinks (with and without  caffeine) Fatty, fried, or spicy food Mint, chocolate, onions, and tomatoes Any other foods that seem to irritate your stomach or cause you pain  Relieve the Pressure Eat smaller meals, even if you have to eat more often. Don't lie down right after you eat. Wait a few hours for your stomach to empty. Avoid tight belts and tight-fitting clothes. Lose excess weight.  Tobacco and Alcohol Avoid smoking tobacco and  drinking alcohol. They can make GERD symptoms worse.

## 2022-12-29 ENCOUNTER — Other Ambulatory Visit: Payer: Self-pay | Admitting: Family Medicine

## 2023-04-23 ENCOUNTER — Ambulatory Visit: Payer: Medicare Other | Admitting: Family Medicine

## 2023-05-20 NOTE — Patient Instructions (Signed)
Asthma Increase montelukast 10 mg to once a day to prevent cough or wheeze Continue Symbicort 160-2 puffs twice a day with a spacer to prevent cough or wheeze Continue albuterol 2 puffs every 4 hours as needed for cough or wheeze or instead, albuterol 0.083% 1 unit vial via nebulizer once every 4 hours as needed for cough or wheeze For asthma flare, begin Spiriva 2.5 mcg 1 puff once a day for 2 weeks or until cough and wheeze free Your last lab work indicates that you do qualify for a biologic medication to control your asthma. Call the clinic if you are interested in this therapy  Allergic rhinitis Continue an antihistamine one a day as needed for a runny nose or itch. Remember to rotate to a different antihistamine about every 3 months. Some examples of over the counter antihistamines include Zyrtec (cetirizine), Xyzal (levocetirizine), Allegra (fexofenadine), and Claritin (loratidine).  Continue Flonase 2 sprays in each nostril once a day as needed for stuffy nose.  In the right nostril, point the applicator out toward the right ear. In the left nostril, point the applicator out toward the left ear Consider saline nasal rinses as needed for nasal symptoms. Use this before any medicated nasal sprays for best result Continue allergen avoidance measures directed toward pollen as listed below  Allergic conjunctivitis Continue cromolyn eye drops 1-2 drops in each eye up to 4 times a day as needed for red or itchy eyes  Reflux Continue dietary and lifestyle modifications as listed below  Call the clinic if this treatment plan is not working well for you  Follow up in 6 months or sooner if needed.  Reducing Pollen Exposure The American Academy of Allergy, Asthma and Immunology suggests the following steps to reduce your exposure to pollen during allergy seasons. Do not hang sheets or clothing out to dry; pollen may collect on these items. Do not mow lawns or spend time around freshly cut grass;  mowing stirs up pollen. Keep windows closed at night.  Keep car windows closed while driving. Minimize morning activities outdoors, a time when pollen counts are usually at their highest. Stay indoors as much as possible when pollen counts or humidity is high and on windy days when pollen tends to remain in the air longer. Use air conditioning when possible.  Many air conditioners have filters that trap the pollen spores. Use a HEPA room air filter to remove pollen form the indoor air you breathe.   Lifestyle Changes for Controlling GERD When you have GERD, stomach acid feels as if it's backing up toward your mouth. Whether or not you take medication to control your GERD, your symptoms can often be improved with lifestyle changes.   Raise Your Head Reflux is more likely to strike when you're lying down flat, because stomach fluid can flow backward more easily. Raising the head of your bed 4-6 inches can help. To do this: Slide blocks or books under the legs at the head of your bed. Or, place a wedge under the mattress. Many foam stores can make a suitable wedge for you. The wedge should run from your waist to the top of your head. Don't just prop your head on several pillows. This increases pressure on your stomach. It can make GERD worse.  Watch Your Eating Habits Certain foods may increase the acid in your stomach or relax the lower esophageal sphincter, making GERD more likely. It's best to avoid the following: Coffee, tea, and carbonated drinks (with and without  caffeine) Fatty, fried, or spicy food Mint, chocolate, onions, and tomatoes Any other foods that seem to irritate your stomach or cause you pain  Relieve the Pressure Eat smaller meals, even if you have to eat more often. Don't lie down right after you eat. Wait a few hours for your stomach to empty. Avoid tight belts and tight-fitting clothes. Lose excess weight.  Tobacco and Alcohol Avoid smoking tobacco and  drinking alcohol. They can make GERD symptoms worse.

## 2023-05-20 NOTE — Progress Notes (Unsigned)
   [address] [place] [address] Dept: [phone]  FOLLOW UP NOTE  Patient ID: Marcia Stevenson, female    DOB: 09/29/1955  Age: 68 y.o. MRN: [id number] Date of Office Visit: 05/21/2023  Assessment  Chief Complaint: No chief complaint on file.  HPI Marcia Stevenson is a 68 year old female who presents to the clinic for follow-up visit.  She was last seen in this clinic on 10/23/2022 by Marinus Sic, FNP, for evaluation of asthma, allergic rhinitis, allergic conjunctivitis, and reflux.  Discussed the use of AI scribe software for clinical note transcription with the patient, who gave verbal consent to proceed.  History of Present Illness      Drug Allergies:  Allergies  Allergen Reactions   Zoster Vaccine Live Hives    Herpes zoster Herpes zoster     Physical Exam: There were no vitals taken for this visit.   Physical Exam  Diagnostics:    Assessment and Plan: No diagnosis found.  No orders of the defined types were placed in this encounter.   There are no Patient Instructions on file for this visit.  No follow-ups on file.    Thank you for the opportunity to care for this patient.  Please do not hesitate to contact me with questions.  Marinus Sic, FNP [facility]

## 2023-05-21 ENCOUNTER — Encounter: Payer: Self-pay | Admitting: Family Medicine

## 2023-05-21 ENCOUNTER — Ambulatory Visit (INDEPENDENT_AMBULATORY_CARE_PROVIDER_SITE_OTHER): Admitting: Family Medicine

## 2023-05-21 ENCOUNTER — Other Ambulatory Visit: Payer: Self-pay

## 2023-05-21 VITALS — BP 124/62 | HR 89 | Temp 98.5°F | Resp 20 | Wt 195.0 lb

## 2023-05-21 DIAGNOSIS — H1013 Acute atopic conjunctivitis, bilateral: Secondary | ICD-10-CM

## 2023-05-21 DIAGNOSIS — K219 Gastro-esophageal reflux disease without esophagitis: Secondary | ICD-10-CM | POA: Diagnosis not present

## 2023-05-21 DIAGNOSIS — J4540 Moderate persistent asthma, uncomplicated: Secondary | ICD-10-CM | POA: Diagnosis not present

## 2023-05-21 DIAGNOSIS — J301 Allergic rhinitis due to pollen: Secondary | ICD-10-CM

## 2023-05-21 DIAGNOSIS — H1010 Acute atopic conjunctivitis, unspecified eye: Secondary | ICD-10-CM

## 2023-05-21 MED ORDER — CROMOLYN SODIUM 4 % OP SOLN: OPHTHALMIC | 1 refills | Status: AC

## 2023-05-21 MED ORDER — CROMOLYN SODIUM 4 % OP SOLN: OPHTHALMIC | 1 refills | Status: DC

## 2023-06-12 ENCOUNTER — Other Ambulatory Visit: Payer: Self-pay | Admitting: *Deleted

## 2023-06-12 ENCOUNTER — Encounter: Payer: Self-pay | Admitting: Family Medicine

## 2023-06-12 ENCOUNTER — Other Ambulatory Visit: Payer: Self-pay | Admitting: Family Medicine

## 2023-06-12 MED ORDER — BUDESONIDE-FORMOTEROL FUMARATE 160-4.5 MCG/ACT IN AERO: 2.0000 | INHALATION_SPRAY | Freq: Two times a day (BID) | RESPIRATORY_TRACT | 5 refills | Status: AC

## 2023-06-12 NOTE — Telephone Encounter (Signed)
 Rexene Catching, I didn't see where you put this patient on this medication, or am I overlooking it? Please advise if you would like for her to have refills for Breyna . Thanks.

## 2023-06-13 NOTE — Telephone Encounter (Signed)
 Symbicort  was ordered yesterday. If Symbicort  is not covered she can have Breyna 

## 2023-08-16 ENCOUNTER — Other Ambulatory Visit: Payer: Self-pay

## 2023-08-16 ENCOUNTER — Encounter: Payer: Self-pay | Admitting: Family Medicine

## 2023-08-16 NOTE — Telephone Encounter (Signed)
 Spoke with Marcia Stevenson and informed her that when I tried to submit prescription for 90 days it kicked back and states that she would have to do mail order through [facility] for 90 day and only 28 days for [facility]. She verbalized understanding and stated to leave the rx the as is.

## 2023-12-10 ENCOUNTER — Ambulatory Visit: Admitting: Family Medicine

## 2023-12-10 ENCOUNTER — Encounter: Payer: Self-pay | Admitting: Family Medicine

## 2023-12-10 VITALS — BP 122/64 | HR 93 | Temp 97.9°F | Resp 20 | Ht 65.5 in | Wt 196.9 lb

## 2023-12-10 DIAGNOSIS — K219 Gastro-esophageal reflux disease without esophagitis: Secondary | ICD-10-CM

## 2023-12-10 DIAGNOSIS — H1013 Acute atopic conjunctivitis, bilateral: Secondary | ICD-10-CM | POA: Diagnosis not present

## 2023-12-10 DIAGNOSIS — J301 Allergic rhinitis due to pollen: Secondary | ICD-10-CM | POA: Diagnosis not present

## 2023-12-10 DIAGNOSIS — H1010 Acute atopic conjunctivitis, unspecified eye: Secondary | ICD-10-CM

## 2023-12-10 DIAGNOSIS — J4540 Moderate persistent asthma, uncomplicated: Secondary | ICD-10-CM

## 2023-12-10 MED ORDER — ALBUTEROL SULFATE (2.5 MG/3ML) 0.083% IN NEBU: INHALATION_SOLUTION | RESPIRATORY_TRACT | 1 refills | Status: AC

## 2023-12-10 MED ORDER — MONTELUKAST SODIUM 10 MG PO TABS: 10.0000 mg | ORAL_TABLET | Freq: Every day | ORAL | 1 refills | Status: AC

## 2023-12-10 MED ORDER — ALBUTEROL SULFATE HFA 108 (90 BASE) MCG/ACT IN AERS: 2.0000 | INHALATION_SPRAY | RESPIRATORY_TRACT | 3 refills | Status: AC | PRN

## 2023-12-10 MED ORDER — BUDESONIDE 0.5 MG/2ML IN SUSP: RESPIRATORY_TRACT | 1 refills | Status: DC

## 2023-12-10 NOTE — Patient Instructions (Addendum)
 Asthma Continue montelukast  10 mg to once a day to prevent cough or wheeze Continue Symbicort  160-2 puffs twice a day with a spacer to prevent cough or wheeze Continue albuterol  2 puffs every 4 hours as needed for cough or wheeze or instead, albuterol  0.083% 1 unit vial via nebulizer once every 4 hours as needed for cough or wheeze For asthma flare, begin budesonide  0.5 ml via nebulizer once or twice a day for 1-2 weeks or until cough and wheeze free, then stop  Allergic rhinitis Continue an antihistamine once a day as needed for a runny nose or itch. Remember to rotate to a different antihistamine about every 3 months. Some examples of over the counter antihistamines include Zyrtec  (cetirizine ), Xyzal  (levocetirizine), Allegra (fexofenadine), and Claritin (loratidine).  Continue Flonase  2 sprays in each nostril once a day as needed for stuffy nose.  In the right nostril, point the applicator out toward the right ear. In the left nostril, point the applicator out toward the left ear Consider saline nasal rinses as needed for nasal symptoms. Use this before any medicated nasal sprays for best result Continue allergen avoidance measures directed toward pollen as listed below  Allergic conjunctivitis Continue cromolyn  eye drops 1-2 drops in each eye up to 4 times a day as needed for red or itchy eyes Consider a lubricating eye drop as needed  Reflux Continue dietary and lifestyle modifications as listed below  Call the clinic if this treatment plan is not working well for you  Follow up in 6 months or sooner if needed.  Reducing Pollen Exposure The American Academy of Allergy, Asthma and Immunology suggests the following steps to reduce your exposure to pollen during allergy seasons. Do not hang sheets or clothing out to dry; pollen may collect on these items. Do not mow lawns or spend time around freshly cut grass; mowing stirs up pollen. Keep windows closed at night.  Keep car windows  closed while driving. Minimize morning activities outdoors, a time when pollen counts are usually at their highest. Stay indoors as much as possible when pollen counts or humidity is high and on windy days when pollen tends to remain in the air longer. Use air conditioning when possible.  Many air conditioners have filters that trap the pollen spores. Use a HEPA room air filter to remove pollen form the indoor air you breathe.   Lifestyle Changes for Controlling GERD When you have GERD, stomach acid feels as if it's backing up toward your mouth. Whether or not you take medication to control your GERD, your symptoms can often be improved with lifestyle changes.   Raise Your Head Reflux is more likely to strike when you're lying down flat, because stomach fluid can flow backward more easily. Raising the head of your bed 4-6 inches can help. To do this: Slide blocks or books under the legs at the head of your bed. Or, place a wedge under the mattress. Many foam stores can make a suitable wedge for you. The wedge should run from your waist to the top of your head. Don't just prop your head on several pillows. This increases pressure on your stomach. It can make GERD worse.  Watch Your Eating Habits Certain foods may increase the acid in your stomach or relax the lower esophageal sphincter, making GERD more likely. It's best to avoid the following: Coffee, tea, and carbonated drinks (with and without caffeine) Fatty, fried, or spicy food Mint, chocolate, onions, and tomatoes Any other foods that seem  to irritate your stomach or cause you pain  Relieve the Pressure Eat smaller meals, even if you have to eat more often. Don't lie down right after you eat. Wait a few hours for your stomach to empty. Avoid tight belts and tight-fitting clothes. Lose excess weight.  Tobacco and Alcohol Avoid smoking tobacco and drinking alcohol. They can make GERD symptoms worse.

## 2023-12-10 NOTE — Progress Notes (Unsigned)
   [address] [place] [address] Dept: [phone]  FOLLOW UP NOTE  Patient ID: Marcia Stevenson, female    DOB: 07-10-1955  Age: 68 y.o. MRN: [id number] Date of Office Visit: 12/10/2023  Assessment  Chief Complaint: No chief complaint on file.  HPI Marcia Stevenson is a 68 year old female who presents to clinic for follow-up visit.  She was last seen in this clinic on 05/21/2023 by Arlean Mutter, FNP, for evaluation of asthma, allergic rhinitis, allergic conjunctivitis, and reflux.  No environmental allergy testing is available for review at this time.  Discussed the use of AI scribe software for clinical note transcription with the patient, who gave verbal consent to proceed.  History of Present Illness Marcia Stevenson is a 68 year old female with asthma who presents with increased inhaler use and respiratory symptoms.  Over the past month, she has experienced increased use of her inhaler and nebulizer, particularly after traveling to [place] in early November. Her symptoms have slightly improved since returning home three days ago. During her time in [place], she required her inhaler and nebulizer daily, which is more than her usual usage.  She has been using Symbicort  twice daily, although she is running low on this medication. She also takes montelukast  daily, but her current supply is from last year. She prefers using Symbicort  over albuterol , but has been using albuterol  more frequently in the past month due to increased symptoms.  Her main symptoms include itchy eyes, a sty on her eye which has been present for almost two weeks, and cough with clear phlegm. She has been using hot compresses and cromolyn  eye drops to manage her eye symptoms. The sty has reduced in size and discharged pus naturally.  No nasal symptoms, reflux, or heartburn. She experiences coughing with clear phlegm, which has been present for the last couple of weeks. She avoids dairy as it exacerbates her  phlegm production and sometimes causes vomiting when she coughs.  She describes herself as an aggressive driver but notes that her reflexes have slowed with age.     Drug Allergies:  Allergies  Allergen Reactions   Zoster Vaccine Live Hives    Herpes zoster Herpes zoster     Physical Exam: There were no vitals taken for this visit.   Physical Exam  Diagnostics: FVC 2.90 which is 111% of predicted value. FEV1 1.99 which is 98% of predicted value. Spirometry indicates normal ventilatory function  Assessment and Plan: No diagnosis found.  No orders of the defined types were placed in this encounter.   There are no Patient Instructions on file for this visit.  No follow-ups on file.    Thank you for the opportunity to care for this patient.  Please do not hesitate to contact me with questions.  Arlean Mutter, FNP [facility]

## 2023-12-11 ENCOUNTER — Encounter: Payer: Self-pay | Admitting: Family Medicine

## 2023-12-13 ENCOUNTER — Telehealth: Payer: Self-pay

## 2023-12-13 ENCOUNTER — Other Ambulatory Visit (HOSPITAL_COMMUNITY): Payer: Self-pay

## 2023-12-13 NOTE — Telephone Encounter (Signed)
*  AA  Pharmacy Patient Advocate Encounter   Received notification from Fax that prior authorization for Budesonide  0.5mg /5ml suspension is required/requested.   Insurance verification completed.   The patient is insured through [facility].   Per test claim: Medication is not eligible for pharmacy benefits and must be billed through medical insurance. As our team only handles pharmacy related prior auths, medical PA's must be submitted by the clinic. Thank you  *must be billed under Medicare Part B

## 2023-12-13 NOTE — Telephone Encounter (Signed)
 Pharmacy Patient Advocate Encounter   Received notification from Fax that prior authorization for Albuterol  Sulfate (2.5 MG/3ML)0.083% nebulizer solution   is required/requested.   Insurance verification completed.   The patient is insured through [facility].   Per test claim: Medication is not eligible for pharmacy benefits and must be billed through medical insurance. As our team only handles pharmacy related prior auths, medical PA's must be submitted by the clinic. Thank you  *must be processed under Medicare Part B

## 2024-01-06 ENCOUNTER — Other Ambulatory Visit: Payer: Self-pay | Admitting: *Deleted

## 2024-01-06 DIAGNOSIS — J4540 Moderate persistent asthma, uncomplicated: Secondary | ICD-10-CM

## 2024-01-06 MED ORDER — BUDESONIDE 0.5 MG/2ML IN SUSP: RESPIRATORY_TRACT | 1 refills | Status: DC

## 2024-01-16 ENCOUNTER — Encounter: Payer: Self-pay | Admitting: Family Medicine

## 2024-01-17 MED ORDER — BUDESONIDE 0.5 MG/2ML IN SUSP: RESPIRATORY_TRACT | 1 refills | Status: AC

## 2024-01-17 NOTE — Addendum Note (Signed)
 Addended by: ONEITA CHRISTIANS D on: 01/17/2024 09:49 AM   Modules accepted: Orders
